# Patient Record
Sex: Male | Born: 1935 | Race: White | Hispanic: No | Marital: Married | Smoking: Never smoker
Health system: Southern US, Community
[De-identification: ages and names within clinical notes are randomized; demographics above are authoritative.]

## PROBLEM LIST (undated history)

## (undated) DIAGNOSIS — E039 Hypothyroidism, unspecified: Secondary | ICD-10-CM

## (undated) DIAGNOSIS — C801 Malignant (primary) neoplasm, unspecified: Secondary | ICD-10-CM

## (undated) DIAGNOSIS — M539 Dorsopathy, unspecified: Secondary | ICD-10-CM

## (undated) DIAGNOSIS — M1990 Unspecified osteoarthritis, unspecified site: Secondary | ICD-10-CM

## (undated) DIAGNOSIS — K219 Gastro-esophageal reflux disease without esophagitis: Secondary | ICD-10-CM

## (undated) DIAGNOSIS — H269 Unspecified cataract: Secondary | ICD-10-CM

## (undated) DIAGNOSIS — Z8719 Personal history of other diseases of the digestive system: Secondary | ICD-10-CM

## (undated) DIAGNOSIS — C20 Malignant neoplasm of rectum: Secondary | ICD-10-CM

## (undated) DIAGNOSIS — H9190 Unspecified hearing loss, unspecified ear: Secondary | ICD-10-CM

---

## 1994-05-19 DIAGNOSIS — Z8719 Personal history of other diseases of the digestive system: Secondary | ICD-10-CM

## 1994-05-19 HISTORY — DX: Personal history of other diseases of the digestive system: Z87.19

## 1994-05-19 HISTORY — PX: HERNIA REPAIR: SHX51

## 1998-05-19 DIAGNOSIS — M1990 Unspecified osteoarthritis, unspecified site: Secondary | ICD-10-CM

## 1998-05-19 HISTORY — DX: Unspecified osteoarthritis, unspecified site: M19.90

## 2003-05-20 DIAGNOSIS — H269 Unspecified cataract: Secondary | ICD-10-CM

## 2003-05-20 HISTORY — DX: Unspecified cataract: H26.9

## 2011-08-18 DIAGNOSIS — E782 Mixed hyperlipidemia: Secondary | ICD-10-CM | POA: Diagnosis not present

## 2011-08-21 DIAGNOSIS — M545 Low back pain: Secondary | ICD-10-CM | POA: Diagnosis not present

## 2011-08-21 DIAGNOSIS — E782 Mixed hyperlipidemia: Secondary | ICD-10-CM | POA: Diagnosis not present

## 2011-08-21 DIAGNOSIS — I10 Essential (primary) hypertension: Secondary | ICD-10-CM | POA: Diagnosis not present

## 2011-08-21 DIAGNOSIS — M1990 Unspecified osteoarthritis, unspecified site: Secondary | ICD-10-CM | POA: Diagnosis not present

## 2011-08-21 DIAGNOSIS — L2089 Other atopic dermatitis: Secondary | ICD-10-CM | POA: Diagnosis not present

## 2011-08-21 DIAGNOSIS — E039 Hypothyroidism, unspecified: Secondary | ICD-10-CM | POA: Diagnosis not present

## 2011-08-27 DIAGNOSIS — L820 Inflamed seborrheic keratosis: Secondary | ICD-10-CM | POA: Diagnosis not present

## 2011-12-19 DIAGNOSIS — E782 Mixed hyperlipidemia: Secondary | ICD-10-CM | POA: Diagnosis not present

## 2011-12-19 DIAGNOSIS — I10 Essential (primary) hypertension: Secondary | ICD-10-CM | POA: Diagnosis not present

## 2011-12-24 DIAGNOSIS — M545 Low back pain: Secondary | ICD-10-CM | POA: Diagnosis not present

## 2011-12-24 DIAGNOSIS — M1990 Unspecified osteoarthritis, unspecified site: Secondary | ICD-10-CM | POA: Diagnosis not present

## 2011-12-24 DIAGNOSIS — E039 Hypothyroidism, unspecified: Secondary | ICD-10-CM | POA: Diagnosis not present

## 2011-12-24 DIAGNOSIS — L2089 Other atopic dermatitis: Secondary | ICD-10-CM | POA: Diagnosis not present

## 2011-12-24 DIAGNOSIS — E782 Mixed hyperlipidemia: Secondary | ICD-10-CM | POA: Diagnosis not present

## 2011-12-24 DIAGNOSIS — I10 Essential (primary) hypertension: Secondary | ICD-10-CM | POA: Diagnosis not present

## 2012-03-15 DIAGNOSIS — Z23 Encounter for immunization: Secondary | ICD-10-CM | POA: Diagnosis not present

## 2012-04-20 DIAGNOSIS — E782 Mixed hyperlipidemia: Secondary | ICD-10-CM | POA: Diagnosis not present

## 2012-04-20 DIAGNOSIS — I10 Essential (primary) hypertension: Secondary | ICD-10-CM | POA: Diagnosis not present

## 2012-04-20 DIAGNOSIS — N400 Enlarged prostate without lower urinary tract symptoms: Secondary | ICD-10-CM | POA: Diagnosis not present

## 2012-04-20 DIAGNOSIS — E039 Hypothyroidism, unspecified: Secondary | ICD-10-CM | POA: Diagnosis not present

## 2012-04-27 DIAGNOSIS — Z0000 Encounter for general adult medical examination without abnormal findings: Secondary | ICD-10-CM | POA: Diagnosis not present

## 2012-04-27 DIAGNOSIS — I10 Essential (primary) hypertension: Secondary | ICD-10-CM | POA: Diagnosis not present

## 2012-04-27 DIAGNOSIS — E782 Mixed hyperlipidemia: Secondary | ICD-10-CM | POA: Diagnosis not present

## 2012-04-27 DIAGNOSIS — E039 Hypothyroidism, unspecified: Secondary | ICD-10-CM | POA: Diagnosis not present

## 2012-08-24 DIAGNOSIS — E782 Mixed hyperlipidemia: Secondary | ICD-10-CM | POA: Diagnosis not present

## 2012-08-24 DIAGNOSIS — I10 Essential (primary) hypertension: Secondary | ICD-10-CM | POA: Diagnosis not present

## 2012-08-24 DIAGNOSIS — E039 Hypothyroidism, unspecified: Secondary | ICD-10-CM | POA: Diagnosis not present

## 2012-08-30 DIAGNOSIS — E039 Hypothyroidism, unspecified: Secondary | ICD-10-CM | POA: Diagnosis not present

## 2012-08-30 DIAGNOSIS — L2089 Other atopic dermatitis: Secondary | ICD-10-CM | POA: Diagnosis not present

## 2012-08-30 DIAGNOSIS — E782 Mixed hyperlipidemia: Secondary | ICD-10-CM | POA: Diagnosis not present

## 2012-08-30 DIAGNOSIS — L500 Allergic urticaria: Secondary | ICD-10-CM | POA: Diagnosis not present

## 2012-08-30 DIAGNOSIS — I10 Essential (primary) hypertension: Secondary | ICD-10-CM | POA: Diagnosis not present

## 2012-08-30 DIAGNOSIS — M545 Low back pain: Secondary | ICD-10-CM | POA: Diagnosis not present

## 2012-08-30 DIAGNOSIS — M1990 Unspecified osteoarthritis, unspecified site: Secondary | ICD-10-CM | POA: Diagnosis not present

## 2012-09-28 DIAGNOSIS — J309 Allergic rhinitis, unspecified: Secondary | ICD-10-CM | POA: Diagnosis not present

## 2012-09-28 DIAGNOSIS — L299 Pruritus, unspecified: Secondary | ICD-10-CM | POA: Diagnosis not present

## 2012-09-28 DIAGNOSIS — H1045 Other chronic allergic conjunctivitis: Secondary | ICD-10-CM | POA: Diagnosis not present

## 2012-09-28 DIAGNOSIS — R21 Rash and other nonspecific skin eruption: Secondary | ICD-10-CM | POA: Diagnosis not present

## 2012-11-02 DIAGNOSIS — H1045 Other chronic allergic conjunctivitis: Secondary | ICD-10-CM | POA: Diagnosis not present

## 2012-11-02 DIAGNOSIS — J309 Allergic rhinitis, unspecified: Secondary | ICD-10-CM | POA: Diagnosis not present

## 2012-12-07 DIAGNOSIS — J309 Allergic rhinitis, unspecified: Secondary | ICD-10-CM | POA: Diagnosis not present

## 2012-12-07 DIAGNOSIS — R21 Rash and other nonspecific skin eruption: Secondary | ICD-10-CM | POA: Diagnosis not present

## 2013-01-03 DIAGNOSIS — I10 Essential (primary) hypertension: Secondary | ICD-10-CM | POA: Diagnosis not present

## 2013-01-03 DIAGNOSIS — E782 Mixed hyperlipidemia: Secondary | ICD-10-CM | POA: Diagnosis not present

## 2013-01-10 DIAGNOSIS — L500 Allergic urticaria: Secondary | ICD-10-CM | POA: Diagnosis not present

## 2013-01-10 DIAGNOSIS — M1990 Unspecified osteoarthritis, unspecified site: Secondary | ICD-10-CM | POA: Diagnosis not present

## 2013-01-10 DIAGNOSIS — L2089 Other atopic dermatitis: Secondary | ICD-10-CM | POA: Diagnosis not present

## 2013-01-10 DIAGNOSIS — I10 Essential (primary) hypertension: Secondary | ICD-10-CM | POA: Diagnosis not present

## 2013-01-10 DIAGNOSIS — E782 Mixed hyperlipidemia: Secondary | ICD-10-CM | POA: Diagnosis not present

## 2013-01-10 DIAGNOSIS — M545 Low back pain: Secondary | ICD-10-CM | POA: Diagnosis not present

## 2013-01-10 DIAGNOSIS — E039 Hypothyroidism, unspecified: Secondary | ICD-10-CM | POA: Diagnosis not present

## 2013-01-20 DIAGNOSIS — L820 Inflamed seborrheic keratosis: Secondary | ICD-10-CM | POA: Diagnosis not present

## 2013-02-28 DIAGNOSIS — Z23 Encounter for immunization: Secondary | ICD-10-CM | POA: Diagnosis not present

## 2013-03-24 DIAGNOSIS — R300 Dysuria: Secondary | ICD-10-CM | POA: Diagnosis not present

## 2013-03-24 DIAGNOSIS — N453 Epididymo-orchitis: Secondary | ICD-10-CM | POA: Diagnosis not present

## 2013-04-26 DIAGNOSIS — R21 Rash and other nonspecific skin eruption: Secondary | ICD-10-CM | POA: Diagnosis not present

## 2013-04-28 DIAGNOSIS — H538 Other visual disturbances: Secondary | ICD-10-CM | POA: Diagnosis not present

## 2013-04-28 DIAGNOSIS — H3530 Unspecified macular degeneration: Secondary | ICD-10-CM | POA: Diagnosis not present

## 2013-04-28 DIAGNOSIS — H2510 Age-related nuclear cataract, unspecified eye: Secondary | ICD-10-CM | POA: Diagnosis not present

## 2013-05-19 HISTORY — PX: CATARACT EXTRACTION W/ INTRAOCULAR LENS IMPLANT: SHX1309

## 2013-06-06 DIAGNOSIS — M1990 Unspecified osteoarthritis, unspecified site: Secondary | ICD-10-CM | POA: Diagnosis not present

## 2013-06-06 DIAGNOSIS — E039 Hypothyroidism, unspecified: Secondary | ICD-10-CM | POA: Diagnosis not present

## 2013-06-06 DIAGNOSIS — I10 Essential (primary) hypertension: Secondary | ICD-10-CM | POA: Diagnosis not present

## 2013-06-06 DIAGNOSIS — E782 Mixed hyperlipidemia: Secondary | ICD-10-CM | POA: Diagnosis not present

## 2013-06-13 DIAGNOSIS — E039 Hypothyroidism, unspecified: Secondary | ICD-10-CM | POA: Diagnosis not present

## 2013-06-13 DIAGNOSIS — L2089 Other atopic dermatitis: Secondary | ICD-10-CM | POA: Diagnosis not present

## 2013-06-13 DIAGNOSIS — Z0000 Encounter for general adult medical examination without abnormal findings: Secondary | ICD-10-CM | POA: Diagnosis not present

## 2013-06-13 DIAGNOSIS — M545 Low back pain, unspecified: Secondary | ICD-10-CM | POA: Diagnosis not present

## 2013-06-13 DIAGNOSIS — I10 Essential (primary) hypertension: Secondary | ICD-10-CM | POA: Diagnosis not present

## 2013-06-13 DIAGNOSIS — Z1331 Encounter for screening for depression: Secondary | ICD-10-CM | POA: Diagnosis not present

## 2013-06-13 DIAGNOSIS — E782 Mixed hyperlipidemia: Secondary | ICD-10-CM | POA: Diagnosis not present

## 2013-06-13 DIAGNOSIS — M1990 Unspecified osteoarthritis, unspecified site: Secondary | ICD-10-CM | POA: Diagnosis not present

## 2013-07-20 DIAGNOSIS — H538 Other visual disturbances: Secondary | ICD-10-CM | POA: Diagnosis not present

## 2013-07-20 DIAGNOSIS — H2510 Age-related nuclear cataract, unspecified eye: Secondary | ICD-10-CM | POA: Diagnosis not present

## 2013-09-14 DIAGNOSIS — L820 Inflamed seborrheic keratosis: Secondary | ICD-10-CM | POA: Diagnosis not present

## 2013-09-16 DIAGNOSIS — J019 Acute sinusitis, unspecified: Secondary | ICD-10-CM | POA: Diagnosis not present

## 2013-09-22 DIAGNOSIS — H2510 Age-related nuclear cataract, unspecified eye: Secondary | ICD-10-CM | POA: Diagnosis not present

## 2013-09-22 DIAGNOSIS — H538 Other visual disturbances: Secondary | ICD-10-CM | POA: Diagnosis not present

## 2013-09-26 DIAGNOSIS — H269 Unspecified cataract: Secondary | ICD-10-CM | POA: Diagnosis not present

## 2013-09-26 DIAGNOSIS — H52209 Unspecified astigmatism, unspecified eye: Secondary | ICD-10-CM | POA: Diagnosis not present

## 2013-09-26 DIAGNOSIS — H2510 Age-related nuclear cataract, unspecified eye: Secondary | ICD-10-CM | POA: Diagnosis not present

## 2013-09-26 DIAGNOSIS — H538 Other visual disturbances: Secondary | ICD-10-CM | POA: Diagnosis not present

## 2013-09-26 DIAGNOSIS — H5200 Hypermetropia, unspecified eye: Secondary | ICD-10-CM | POA: Diagnosis not present

## 2013-09-26 DIAGNOSIS — K219 Gastro-esophageal reflux disease without esophagitis: Secondary | ICD-10-CM | POA: Diagnosis not present

## 2013-09-26 DIAGNOSIS — I10 Essential (primary) hypertension: Secondary | ICD-10-CM | POA: Diagnosis not present

## 2013-09-26 DIAGNOSIS — Z79899 Other long term (current) drug therapy: Secondary | ICD-10-CM | POA: Diagnosis not present

## 2013-09-26 DIAGNOSIS — H02059 Trichiasis without entropian unspecified eye, unspecified eyelid: Secondary | ICD-10-CM | POA: Diagnosis not present

## 2013-09-26 DIAGNOSIS — H524 Presbyopia: Secondary | ICD-10-CM | POA: Diagnosis not present

## 2013-09-26 DIAGNOSIS — Z7982 Long term (current) use of aspirin: Secondary | ICD-10-CM | POA: Diagnosis not present

## 2013-09-26 DIAGNOSIS — H11049 Peripheral pterygium, stationary, unspecified eye: Secondary | ICD-10-CM | POA: Diagnosis not present

## 2013-09-26 DIAGNOSIS — H1045 Other chronic allergic conjunctivitis: Secondary | ICD-10-CM | POA: Diagnosis not present

## 2013-09-26 DIAGNOSIS — H3530 Unspecified macular degeneration: Secondary | ICD-10-CM | POA: Diagnosis not present

## 2013-10-12 DIAGNOSIS — I10 Essential (primary) hypertension: Secondary | ICD-10-CM | POA: Diagnosis not present

## 2013-10-12 DIAGNOSIS — E782 Mixed hyperlipidemia: Secondary | ICD-10-CM | POA: Diagnosis not present

## 2013-10-19 DIAGNOSIS — E782 Mixed hyperlipidemia: Secondary | ICD-10-CM | POA: Diagnosis not present

## 2013-10-19 DIAGNOSIS — M545 Low back pain, unspecified: Secondary | ICD-10-CM | POA: Diagnosis not present

## 2013-10-19 DIAGNOSIS — M1990 Unspecified osteoarthritis, unspecified site: Secondary | ICD-10-CM | POA: Diagnosis not present

## 2013-10-19 DIAGNOSIS — Z1331 Encounter for screening for depression: Secondary | ICD-10-CM | POA: Diagnosis not present

## 2013-10-19 DIAGNOSIS — E039 Hypothyroidism, unspecified: Secondary | ICD-10-CM | POA: Diagnosis not present

## 2013-10-19 DIAGNOSIS — I10 Essential (primary) hypertension: Secondary | ICD-10-CM | POA: Diagnosis not present

## 2013-10-19 DIAGNOSIS — L500 Allergic urticaria: Secondary | ICD-10-CM | POA: Diagnosis not present

## 2013-10-19 DIAGNOSIS — L2089 Other atopic dermatitis: Secondary | ICD-10-CM | POA: Diagnosis not present

## 2013-10-20 DIAGNOSIS — R197 Diarrhea, unspecified: Secondary | ICD-10-CM | POA: Diagnosis not present

## 2013-11-23 DIAGNOSIS — K625 Hemorrhage of anus and rectum: Secondary | ICD-10-CM | POA: Diagnosis not present

## 2013-12-01 DIAGNOSIS — Z9109 Other allergy status, other than to drugs and biological substances: Secondary | ICD-10-CM | POA: Diagnosis not present

## 2013-12-01 DIAGNOSIS — K621 Rectal polyp: Secondary | ICD-10-CM | POA: Diagnosis not present

## 2013-12-01 DIAGNOSIS — C20 Malignant neoplasm of rectum: Secondary | ICD-10-CM | POA: Diagnosis not present

## 2013-12-01 DIAGNOSIS — C189 Malignant neoplasm of colon, unspecified: Secondary | ICD-10-CM | POA: Diagnosis not present

## 2013-12-01 DIAGNOSIS — K620 Anal polyp: Secondary | ICD-10-CM | POA: Diagnosis not present

## 2013-12-01 DIAGNOSIS — K219 Gastro-esophageal reflux disease without esophagitis: Secondary | ICD-10-CM | POA: Diagnosis not present

## 2013-12-01 DIAGNOSIS — K625 Hemorrhage of anus and rectum: Secondary | ICD-10-CM | POA: Diagnosis not present

## 2013-12-01 DIAGNOSIS — E039 Hypothyroidism, unspecified: Secondary | ICD-10-CM | POA: Diagnosis not present

## 2013-12-01 DIAGNOSIS — M4120 Other idiopathic scoliosis, site unspecified: Secondary | ICD-10-CM | POA: Diagnosis not present

## 2013-12-01 DIAGNOSIS — Z79899 Other long term (current) drug therapy: Secondary | ICD-10-CM | POA: Diagnosis not present

## 2013-12-01 DIAGNOSIS — I10 Essential (primary) hypertension: Secondary | ICD-10-CM | POA: Diagnosis not present

## 2013-12-08 DIAGNOSIS — C20 Malignant neoplasm of rectum: Secondary | ICD-10-CM | POA: Diagnosis not present

## 2013-12-08 DIAGNOSIS — C19 Malignant neoplasm of rectosigmoid junction: Secondary | ICD-10-CM | POA: Diagnosis not present

## 2013-12-08 DIAGNOSIS — C189 Malignant neoplasm of colon, unspecified: Secondary | ICD-10-CM | POA: Diagnosis not present

## 2013-12-13 ENCOUNTER — Other Ambulatory Visit: Payer: Self-pay

## 2013-12-13 ENCOUNTER — Telehealth: Payer: Self-pay

## 2013-12-13 DIAGNOSIS — C20 Malignant neoplasm of rectum: Secondary | ICD-10-CM

## 2013-12-13 DIAGNOSIS — R935 Abnormal findings on diagnostic imaging of other abdominal regions, including retroperitoneum: Secondary | ICD-10-CM | POA: Diagnosis not present

## 2013-12-13 DIAGNOSIS — C19 Malignant neoplasm of rectosigmoid junction: Secondary | ICD-10-CM | POA: Diagnosis not present

## 2013-12-13 NOTE — Telephone Encounter (Signed)
I have spoken to Casper at [facility]. The instructions for the EUS have been faxed to her. She will give these to Mr Dull. Spoke with Mrs. Fesler and advised her of the date and the instructions to be picked up.

## 2013-12-22 ENCOUNTER — Ambulatory Visit (HOSPITAL_COMMUNITY)
Admission: RE | Admit: 2013-12-22 | Discharge: 2013-12-22 | Disposition: A | Discharge status: Home / Self Care | Payer: Medicare Other | Source: Ambulatory Visit | Attending: Gastroenterology | Admitting: Gastroenterology

## 2013-12-22 ENCOUNTER — Encounter (HOSPITAL_COMMUNITY)
Admission: RE | Disposition: A | Discharge status: Home / Self Care | Payer: Self-pay | Source: Ambulatory Visit | Attending: Gastroenterology

## 2013-12-22 ENCOUNTER — Encounter (HOSPITAL_COMMUNITY): Payer: Self-pay

## 2013-12-22 DIAGNOSIS — C20 Malignant neoplasm of rectum: Secondary | ICD-10-CM | POA: Diagnosis not present

## 2013-12-22 DIAGNOSIS — M1990 Unspecified osteoarthritis, unspecified site: Secondary | ICD-10-CM | POA: Diagnosis not present

## 2013-12-22 DIAGNOSIS — K219 Gastro-esophageal reflux disease without esophagitis: Secondary | ICD-10-CM | POA: Insufficient documentation

## 2013-12-22 DIAGNOSIS — E039 Hypothyroidism, unspecified: Secondary | ICD-10-CM | POA: Insufficient documentation

## 2013-12-22 HISTORY — DX: Gastro-esophageal reflux disease without esophagitis: K21.9

## 2013-12-22 HISTORY — DX: Dorsopathy, unspecified: M53.9

## 2013-12-22 HISTORY — DX: Unspecified osteoarthritis, unspecified site: M19.90

## 2013-12-22 HISTORY — PX: EUS: SHX5427

## 2013-12-22 HISTORY — DX: Hypothyroidism, unspecified: E03.9

## 2013-12-22 HISTORY — DX: Personal history of other diseases of the digestive system: Z87.19

## 2013-12-22 HISTORY — DX: Unspecified cataract: H26.9

## 2013-12-22 SURGERY — ULTRASOUND, LOWER GI TRACT, ENDOSCOPIC
Anesthesia: Moderate Sedation

## 2013-12-22 MED ORDER — MIDAZOLAM HCL 10 MG/2ML IJ SOLN
INTRAMUSCULAR | Status: AC
  Filled 2013-12-22: qty 4

## 2013-12-22 MED ORDER — MIDAZOLAM HCL 10 MG/2ML IJ SOLN
INTRAMUSCULAR | Status: DC | PRN
  Administered 2013-12-22: 1 mg via INTRAVENOUS
  Administered 2013-12-22: 2 mg via INTRAVENOUS

## 2013-12-22 MED ORDER — SODIUM CHLORIDE 0.9 % IV SOLN
INTRAVENOUS | Status: DC
  Administered 2013-12-22: 14:00:00 via INTRAVENOUS

## 2013-12-22 MED ORDER — FENTANYL CITRATE 0.05 MG/ML IJ SOLN
INTRAMUSCULAR | Status: DC | PRN
  Administered 2013-12-22: 25 ug via INTRAVENOUS
  Administered 2013-12-22: 12.5 ug via INTRAVENOUS

## 2013-12-22 MED ORDER — FENTANYL CITRATE 0.05 MG/ML IJ SOLN
INTRAMUSCULAR | Status: AC
  Filled 2013-12-22: qty 2

## 2013-12-22 NOTE — Discharge Instructions (Signed)

## 2013-12-22 NOTE — Op Note (Signed)
[facility] [place], [address]   ENDOSCOPIC ULTRASOUND PROCEDURE REPORT  PATIENT: Michael Baldwin, Michael Baldwin  MR#: [id number] BIRTHDATE: Apr 19, 1933  GENDER: Male ENDOSCOPIST: Milus Banister, MD REFERRED BY:  Dr. Burke Keels [facility] in [place], [place]) PROCEDURE DATE:  12/22/2013 PROCEDURE:   Lower EUS ASA CLASS:      Class II INDICATIONS:   recently diagnosed rectal cancer (colonoscopy Dr. Anthony Sar); CT abdomen, pelvis without obvious metastatic disease. MEDICATIONS:  Fentanyl 37.5 mcg IV and Versed 3 mg IV  DESCRIPTION OF PROCEDURE:   After the risks benefits and alternatives of the procedure were  explained, informed consent was obtained. The patient was then placed in the left, lateral, decubitus postion and IV sedation was administered. Throughout the procedure, the patients blood pressure, pulse and oxygen saturations were monitored continuously.  Under direct visualization, the Pentax Radial EUS [id number]  endoscope was introduced through the anus and advanced to the sigmoid colon . Water was used as necessary to provide an acoustic interface.  Upon completion of the imaging, water was removed and the patient was sent to the recovery room in satisfactory condition.  Sigmoidoscopic findings: 1. Malignant appearing mass in posterior rectum. This was non-obstructive, occupied 20% lumen circumference, lay in posterior rectum with distal edge 8cm from the anal verge.  EUS findings: 1. The mass above correlated with a 2.8cm hypoechoic, heterogeneous mass that appears to invade into but not through the muscularis propria layer (uT2). 2. No perirectal or iliac adenopathy (uN0).  Impression: Non-obstructive 2.8cm uT2N0 (stage I) rectal adenocarcinoma that lays in posterior rectum with distal edge 8cm from the anal verge.   _______________________________ eSignedMilus Banister, MD 12/22/2013 2:35 PM

## 2013-12-22 NOTE — H&P (Signed)
  HPI: This is a man with recently diagnosed rectal adenocarcinoma (Dr.DeMason colonoscopy).  NO mets on CT abd/pelvis    Past Medical History  Diagnosis Date  . H/O hiatal hernia 1996  . Cataract, right 2005  . Spinal disorder     scholeosis  . Osteoarthritis 2000  . GERD (gastroesophageal reflux disease)   . Hypothyroidism     Past Surgical History  Procedure Laterality Date  . Cataract extraction w/ intraocular lens implant  2015  . Hernia repair  1996    Current Facility-Administered Medications  Medication Dose Route Frequency Provider Last Rate Last Dose  . 0.9 %  sodium chloride infusion   Intravenous Continuous Milus Banister, MD 20 mL/hr at 12/22/13 1356      Allergies as of 12/13/2013  . (Not on File)    History reviewed. No pertinent family history.  History   Social History  . Marital Status: Married    Spouse Name: N/A    Number of Children: N/A  . Years of Education: N/A   Occupational History  . Not on file.   Social History Main Topics  . Smoking status: Never Smoker   . Smokeless tobacco: Not on file  . Alcohol Use: No  . Drug Use: No  . Sexual Activity: Not on file   Other Topics Concern  . Not on file   Social History Narrative  . No narrative on file      Physical Exam: BP 133/79  Pulse 68  Temp(Src) 98.3 F (36.8 C) (Oral)  Resp 17  Ht 5\' 9"  (1.753 m)  Wt 131 lb (59.421 kg)  BMI 19.34 kg/m2  SpO2 99% Constitutional: generally well-appearing Psychiatric: alert and oriented x3 Abdomen: soft, nontender, nondistended, no obvious ascites, no peritoneal signs, normal bowel sounds     Assessment and plan: 80 y.o. male with recently diagnosed rectal cancer   For EUS staging today

## 2013-12-26 ENCOUNTER — Encounter (HOSPITAL_COMMUNITY): Payer: Self-pay | Admitting: Gastroenterology

## 2013-12-26 DIAGNOSIS — C20 Malignant neoplasm of rectum: Secondary | ICD-10-CM | POA: Diagnosis not present

## 2013-12-28 DIAGNOSIS — C189 Malignant neoplasm of colon, unspecified: Secondary | ICD-10-CM | POA: Diagnosis not present

## 2013-12-28 DIAGNOSIS — R5381 Other malaise: Secondary | ICD-10-CM | POA: Diagnosis not present

## 2013-12-28 DIAGNOSIS — C20 Malignant neoplasm of rectum: Secondary | ICD-10-CM | POA: Diagnosis not present

## 2014-01-02 DIAGNOSIS — M4120 Other idiopathic scoliosis, site unspecified: Secondary | ICD-10-CM | POA: Diagnosis not present

## 2014-01-02 DIAGNOSIS — E7800 Pure hypercholesterolemia, unspecified: Secondary | ICD-10-CM | POA: Diagnosis not present

## 2014-01-02 DIAGNOSIS — Z803 Family history of malignant neoplasm of breast: Secondary | ICD-10-CM | POA: Diagnosis not present

## 2014-01-02 DIAGNOSIS — C20 Malignant neoplasm of rectum: Secondary | ICD-10-CM | POA: Diagnosis not present

## 2014-01-02 DIAGNOSIS — Z9109 Other allergy status, other than to drugs and biological substances: Secondary | ICD-10-CM | POA: Diagnosis not present

## 2014-01-02 DIAGNOSIS — Z510 Encounter for antineoplastic radiation therapy: Secondary | ICD-10-CM | POA: Diagnosis not present

## 2014-01-02 DIAGNOSIS — M1990 Unspecified osteoarthritis, unspecified site: Secondary | ICD-10-CM | POA: Diagnosis not present

## 2014-01-02 DIAGNOSIS — Z87891 Personal history of nicotine dependence: Secondary | ICD-10-CM | POA: Diagnosis not present

## 2014-01-02 DIAGNOSIS — Z79899 Other long term (current) drug therapy: Secondary | ICD-10-CM | POA: Diagnosis not present

## 2014-01-04 DIAGNOSIS — M1990 Unspecified osteoarthritis, unspecified site: Secondary | ICD-10-CM | POA: Diagnosis not present

## 2014-01-04 DIAGNOSIS — C20 Malignant neoplasm of rectum: Secondary | ICD-10-CM | POA: Diagnosis not present

## 2014-01-04 DIAGNOSIS — M4120 Other idiopathic scoliosis, site unspecified: Secondary | ICD-10-CM | POA: Diagnosis not present

## 2014-01-04 DIAGNOSIS — E7800 Pure hypercholesterolemia, unspecified: Secondary | ICD-10-CM | POA: Diagnosis not present

## 2014-01-04 DIAGNOSIS — Z79899 Other long term (current) drug therapy: Secondary | ICD-10-CM | POA: Diagnosis not present

## 2014-01-04 DIAGNOSIS — Z510 Encounter for antineoplastic radiation therapy: Secondary | ICD-10-CM | POA: Diagnosis not present

## 2014-01-05 DIAGNOSIS — Z79899 Other long term (current) drug therapy: Secondary | ICD-10-CM | POA: Diagnosis not present

## 2014-01-05 DIAGNOSIS — M4120 Other idiopathic scoliosis, site unspecified: Secondary | ICD-10-CM | POA: Diagnosis not present

## 2014-01-05 DIAGNOSIS — M1990 Unspecified osteoarthritis, unspecified site: Secondary | ICD-10-CM | POA: Diagnosis not present

## 2014-01-05 DIAGNOSIS — Z510 Encounter for antineoplastic radiation therapy: Secondary | ICD-10-CM | POA: Diagnosis not present

## 2014-01-05 DIAGNOSIS — C20 Malignant neoplasm of rectum: Secondary | ICD-10-CM | POA: Diagnosis not present

## 2014-01-05 DIAGNOSIS — E7800 Pure hypercholesterolemia, unspecified: Secondary | ICD-10-CM | POA: Diagnosis not present

## 2014-01-06 DIAGNOSIS — M1990 Unspecified osteoarthritis, unspecified site: Secondary | ICD-10-CM | POA: Diagnosis not present

## 2014-01-06 DIAGNOSIS — Z79899 Other long term (current) drug therapy: Secondary | ICD-10-CM | POA: Diagnosis not present

## 2014-01-06 DIAGNOSIS — C20 Malignant neoplasm of rectum: Secondary | ICD-10-CM | POA: Diagnosis not present

## 2014-01-06 DIAGNOSIS — Z510 Encounter for antineoplastic radiation therapy: Secondary | ICD-10-CM | POA: Diagnosis not present

## 2014-01-06 DIAGNOSIS — M4120 Other idiopathic scoliosis, site unspecified: Secondary | ICD-10-CM | POA: Diagnosis not present

## 2014-01-06 DIAGNOSIS — E7800 Pure hypercholesterolemia, unspecified: Secondary | ICD-10-CM | POA: Diagnosis not present

## 2014-01-09 DIAGNOSIS — Z79899 Other long term (current) drug therapy: Secondary | ICD-10-CM | POA: Diagnosis not present

## 2014-01-09 DIAGNOSIS — M4120 Other idiopathic scoliosis, site unspecified: Secondary | ICD-10-CM | POA: Diagnosis not present

## 2014-01-09 DIAGNOSIS — C20 Malignant neoplasm of rectum: Secondary | ICD-10-CM | POA: Diagnosis not present

## 2014-01-09 DIAGNOSIS — E7800 Pure hypercholesterolemia, unspecified: Secondary | ICD-10-CM | POA: Diagnosis not present

## 2014-01-09 DIAGNOSIS — M1990 Unspecified osteoarthritis, unspecified site: Secondary | ICD-10-CM | POA: Diagnosis not present

## 2014-01-09 DIAGNOSIS — Z510 Encounter for antineoplastic radiation therapy: Secondary | ICD-10-CM | POA: Diagnosis not present

## 2014-01-10 DIAGNOSIS — M4120 Other idiopathic scoliosis, site unspecified: Secondary | ICD-10-CM | POA: Diagnosis not present

## 2014-01-10 DIAGNOSIS — Z510 Encounter for antineoplastic radiation therapy: Secondary | ICD-10-CM | POA: Diagnosis not present

## 2014-01-10 DIAGNOSIS — Z79899 Other long term (current) drug therapy: Secondary | ICD-10-CM | POA: Diagnosis not present

## 2014-01-10 DIAGNOSIS — M1990 Unspecified osteoarthritis, unspecified site: Secondary | ICD-10-CM | POA: Diagnosis not present

## 2014-01-10 DIAGNOSIS — E7800 Pure hypercholesterolemia, unspecified: Secondary | ICD-10-CM | POA: Diagnosis not present

## 2014-01-10 DIAGNOSIS — C20 Malignant neoplasm of rectum: Secondary | ICD-10-CM | POA: Diagnosis not present

## 2014-01-11 DIAGNOSIS — E7800 Pure hypercholesterolemia, unspecified: Secondary | ICD-10-CM | POA: Diagnosis not present

## 2014-01-11 DIAGNOSIS — M4120 Other idiopathic scoliosis, site unspecified: Secondary | ICD-10-CM | POA: Diagnosis not present

## 2014-01-11 DIAGNOSIS — M1990 Unspecified osteoarthritis, unspecified site: Secondary | ICD-10-CM | POA: Diagnosis not present

## 2014-01-11 DIAGNOSIS — Z79899 Other long term (current) drug therapy: Secondary | ICD-10-CM | POA: Diagnosis not present

## 2014-01-11 DIAGNOSIS — R5381 Other malaise: Secondary | ICD-10-CM | POA: Diagnosis not present

## 2014-01-11 DIAGNOSIS — C20 Malignant neoplasm of rectum: Secondary | ICD-10-CM | POA: Diagnosis not present

## 2014-01-11 DIAGNOSIS — Z510 Encounter for antineoplastic radiation therapy: Secondary | ICD-10-CM | POA: Diagnosis not present

## 2014-01-11 DIAGNOSIS — E875 Hyperkalemia: Secondary | ICD-10-CM | POA: Diagnosis not present

## 2014-01-12 DIAGNOSIS — M1990 Unspecified osteoarthritis, unspecified site: Secondary | ICD-10-CM | POA: Diagnosis not present

## 2014-01-12 DIAGNOSIS — Z510 Encounter for antineoplastic radiation therapy: Secondary | ICD-10-CM | POA: Diagnosis not present

## 2014-01-12 DIAGNOSIS — E7800 Pure hypercholesterolemia, unspecified: Secondary | ICD-10-CM | POA: Diagnosis not present

## 2014-01-12 DIAGNOSIS — M4120 Other idiopathic scoliosis, site unspecified: Secondary | ICD-10-CM | POA: Diagnosis not present

## 2014-01-12 DIAGNOSIS — C20 Malignant neoplasm of rectum: Secondary | ICD-10-CM | POA: Diagnosis not present

## 2014-01-12 DIAGNOSIS — Z79899 Other long term (current) drug therapy: Secondary | ICD-10-CM | POA: Diagnosis not present

## 2014-01-13 DIAGNOSIS — Z510 Encounter for antineoplastic radiation therapy: Secondary | ICD-10-CM | POA: Diagnosis not present

## 2014-01-13 DIAGNOSIS — C20 Malignant neoplasm of rectum: Secondary | ICD-10-CM | POA: Diagnosis not present

## 2014-01-13 DIAGNOSIS — Z79899 Other long term (current) drug therapy: Secondary | ICD-10-CM | POA: Diagnosis not present

## 2014-01-13 DIAGNOSIS — M4120 Other idiopathic scoliosis, site unspecified: Secondary | ICD-10-CM | POA: Diagnosis not present

## 2014-01-13 DIAGNOSIS — E7800 Pure hypercholesterolemia, unspecified: Secondary | ICD-10-CM | POA: Diagnosis not present

## 2014-01-13 DIAGNOSIS — M1990 Unspecified osteoarthritis, unspecified site: Secondary | ICD-10-CM | POA: Diagnosis not present

## 2014-01-16 DIAGNOSIS — M4120 Other idiopathic scoliosis, site unspecified: Secondary | ICD-10-CM | POA: Diagnosis not present

## 2014-01-16 DIAGNOSIS — E7800 Pure hypercholesterolemia, unspecified: Secondary | ICD-10-CM | POA: Diagnosis not present

## 2014-01-16 DIAGNOSIS — Z79899 Other long term (current) drug therapy: Secondary | ICD-10-CM | POA: Diagnosis not present

## 2014-01-16 DIAGNOSIS — Z510 Encounter for antineoplastic radiation therapy: Secondary | ICD-10-CM | POA: Diagnosis not present

## 2014-01-16 DIAGNOSIS — C20 Malignant neoplasm of rectum: Secondary | ICD-10-CM | POA: Diagnosis not present

## 2014-01-16 DIAGNOSIS — M1990 Unspecified osteoarthritis, unspecified site: Secondary | ICD-10-CM | POA: Diagnosis not present

## 2014-01-17 DIAGNOSIS — C20 Malignant neoplasm of rectum: Secondary | ICD-10-CM | POA: Diagnosis not present

## 2014-01-17 DIAGNOSIS — Z510 Encounter for antineoplastic radiation therapy: Secondary | ICD-10-CM | POA: Diagnosis not present

## 2014-01-19 DIAGNOSIS — C20 Malignant neoplasm of rectum: Secondary | ICD-10-CM | POA: Diagnosis not present

## 2014-01-24 DIAGNOSIS — C20 Malignant neoplasm of rectum: Secondary | ICD-10-CM | POA: Diagnosis not present

## 2014-01-25 DIAGNOSIS — C20 Malignant neoplasm of rectum: Secondary | ICD-10-CM | POA: Diagnosis not present

## 2014-01-27 DIAGNOSIS — C20 Malignant neoplasm of rectum: Secondary | ICD-10-CM | POA: Diagnosis not present

## 2014-01-27 DIAGNOSIS — R197 Diarrhea, unspecified: Secondary | ICD-10-CM | POA: Diagnosis not present

## 2014-01-30 DIAGNOSIS — E0590 Thyrotoxicosis, unspecified without thyrotoxic crisis or storm: Secondary | ICD-10-CM | POA: Diagnosis not present

## 2014-01-30 DIAGNOSIS — E860 Dehydration: Secondary | ICD-10-CM | POA: Diagnosis not present

## 2014-01-30 DIAGNOSIS — C20 Malignant neoplasm of rectum: Secondary | ICD-10-CM | POA: Diagnosis not present

## 2014-01-30 DIAGNOSIS — I10 Essential (primary) hypertension: Secondary | ICD-10-CM | POA: Diagnosis not present

## 2014-01-30 DIAGNOSIS — Z79899 Other long term (current) drug therapy: Secondary | ICD-10-CM | POA: Diagnosis not present

## 2014-01-30 DIAGNOSIS — K219 Gastro-esophageal reflux disease without esophagitis: Secondary | ICD-10-CM | POA: Diagnosis not present

## 2014-01-30 DIAGNOSIS — Z87891 Personal history of nicotine dependence: Secondary | ICD-10-CM | POA: Diagnosis not present

## 2014-01-30 DIAGNOSIS — R197 Diarrhea, unspecified: Secondary | ICD-10-CM | POA: Diagnosis not present

## 2014-01-31 DIAGNOSIS — C20 Malignant neoplasm of rectum: Secondary | ICD-10-CM | POA: Diagnosis not present

## 2014-01-31 DIAGNOSIS — R197 Diarrhea, unspecified: Secondary | ICD-10-CM | POA: Diagnosis not present

## 2014-01-31 DIAGNOSIS — R112 Nausea with vomiting, unspecified: Secondary | ICD-10-CM | POA: Diagnosis not present

## 2014-01-31 DIAGNOSIS — E860 Dehydration: Secondary | ICD-10-CM | POA: Diagnosis not present

## 2014-02-01 DIAGNOSIS — R197 Diarrhea, unspecified: Secondary | ICD-10-CM | POA: Diagnosis not present

## 2014-02-01 DIAGNOSIS — E860 Dehydration: Secondary | ICD-10-CM | POA: Diagnosis not present

## 2014-02-01 DIAGNOSIS — C20 Malignant neoplasm of rectum: Secondary | ICD-10-CM | POA: Diagnosis not present

## 2014-02-02 DIAGNOSIS — R197 Diarrhea, unspecified: Secondary | ICD-10-CM | POA: Diagnosis not present

## 2014-02-03 DIAGNOSIS — R197 Diarrhea, unspecified: Secondary | ICD-10-CM | POA: Diagnosis not present

## 2014-02-03 DIAGNOSIS — C20 Malignant neoplasm of rectum: Secondary | ICD-10-CM | POA: Diagnosis not present

## 2014-02-03 DIAGNOSIS — R634 Abnormal weight loss: Secondary | ICD-10-CM | POA: Diagnosis not present

## 2014-02-03 DIAGNOSIS — E876 Hypokalemia: Secondary | ICD-10-CM | POA: Diagnosis not present

## 2014-02-09 DIAGNOSIS — C20 Malignant neoplasm of rectum: Secondary | ICD-10-CM | POA: Diagnosis not present

## 2014-02-10 DIAGNOSIS — C189 Malignant neoplasm of colon, unspecified: Secondary | ICD-10-CM | POA: Diagnosis not present

## 2014-02-10 DIAGNOSIS — C20 Malignant neoplasm of rectum: Secondary | ICD-10-CM | POA: Diagnosis not present

## 2014-02-16 DIAGNOSIS — C20 Malignant neoplasm of rectum: Secondary | ICD-10-CM | POA: Diagnosis not present

## 2014-02-16 DIAGNOSIS — Z510 Encounter for antineoplastic radiation therapy: Secondary | ICD-10-CM | POA: Diagnosis not present

## 2014-02-17 DIAGNOSIS — C20 Malignant neoplasm of rectum: Secondary | ICD-10-CM | POA: Diagnosis not present

## 2014-02-21 DIAGNOSIS — C20 Malignant neoplasm of rectum: Secondary | ICD-10-CM | POA: Diagnosis not present

## 2014-02-24 DIAGNOSIS — C189 Malignant neoplasm of colon, unspecified: Secondary | ICD-10-CM | POA: Diagnosis not present

## 2014-02-24 DIAGNOSIS — R197 Diarrhea, unspecified: Secondary | ICD-10-CM | POA: Diagnosis not present

## 2014-03-01 DIAGNOSIS — E782 Mixed hyperlipidemia: Secondary | ICD-10-CM | POA: Diagnosis not present

## 2014-03-01 DIAGNOSIS — E039 Hypothyroidism, unspecified: Secondary | ICD-10-CM | POA: Diagnosis not present

## 2014-03-01 DIAGNOSIS — I10 Essential (primary) hypertension: Secondary | ICD-10-CM | POA: Diagnosis not present

## 2014-03-01 DIAGNOSIS — C20 Malignant neoplasm of rectum: Secondary | ICD-10-CM | POA: Diagnosis not present

## 2014-03-01 DIAGNOSIS — M545 Low back pain: Secondary | ICD-10-CM | POA: Diagnosis not present

## 2014-03-17 DIAGNOSIS — C189 Malignant neoplasm of colon, unspecified: Secondary | ICD-10-CM | POA: Diagnosis not present

## 2014-03-17 DIAGNOSIS — C20 Malignant neoplasm of rectum: Secondary | ICD-10-CM | POA: Diagnosis not present

## 2014-03-20 DIAGNOSIS — C20 Malignant neoplasm of rectum: Secondary | ICD-10-CM | POA: Diagnosis not present

## 2014-03-20 DIAGNOSIS — Z923 Personal history of irradiation: Secondary | ICD-10-CM | POA: Diagnosis not present

## 2014-04-03 DIAGNOSIS — C20 Malignant neoplasm of rectum: Secondary | ICD-10-CM | POA: Diagnosis not present

## 2014-04-10 DIAGNOSIS — Z23 Encounter for immunization: Secondary | ICD-10-CM | POA: Diagnosis not present

## 2014-04-18 DIAGNOSIS — K219 Gastro-esophageal reflux disease without esophagitis: Secondary | ICD-10-CM | POA: Diagnosis not present

## 2014-04-18 DIAGNOSIS — E039 Hypothyroidism, unspecified: Secondary | ICD-10-CM | POA: Diagnosis not present

## 2014-04-18 DIAGNOSIS — M419 Scoliosis, unspecified: Secondary | ICD-10-CM | POA: Diagnosis not present

## 2014-04-18 DIAGNOSIS — Z79899 Other long term (current) drug therapy: Secondary | ICD-10-CM | POA: Diagnosis not present

## 2014-04-18 DIAGNOSIS — C20 Malignant neoplasm of rectum: Secondary | ICD-10-CM | POA: Diagnosis not present

## 2014-04-18 DIAGNOSIS — Z9221 Personal history of antineoplastic chemotherapy: Secondary | ICD-10-CM | POA: Diagnosis not present

## 2014-04-18 DIAGNOSIS — Z91048 Other nonmedicinal substance allergy status: Secondary | ICD-10-CM | POA: Diagnosis not present

## 2014-04-18 DIAGNOSIS — D128 Benign neoplasm of rectum: Secondary | ICD-10-CM | POA: Diagnosis not present

## 2014-04-18 DIAGNOSIS — Z85048 Personal history of other malignant neoplasm of rectum, rectosigmoid junction, and anus: Secondary | ICD-10-CM | POA: Diagnosis not present

## 2014-04-18 DIAGNOSIS — Z923 Personal history of irradiation: Secondary | ICD-10-CM | POA: Diagnosis not present

## 2014-04-18 DIAGNOSIS — I10 Essential (primary) hypertension: Secondary | ICD-10-CM | POA: Diagnosis not present

## 2014-04-19 DIAGNOSIS — D128 Benign neoplasm of rectum: Secondary | ICD-10-CM | POA: Diagnosis not present

## 2014-04-25 DIAGNOSIS — C20 Malignant neoplasm of rectum: Secondary | ICD-10-CM | POA: Diagnosis not present

## 2014-05-03 DIAGNOSIS — R197 Diarrhea, unspecified: Secondary | ICD-10-CM | POA: Diagnosis not present

## 2014-05-03 DIAGNOSIS — R5382 Chronic fatigue, unspecified: Secondary | ICD-10-CM | POA: Diagnosis not present

## 2014-05-03 DIAGNOSIS — C20 Malignant neoplasm of rectum: Secondary | ICD-10-CM | POA: Diagnosis not present

## 2014-05-03 DIAGNOSIS — Z5111 Encounter for antineoplastic chemotherapy: Secondary | ICD-10-CM | POA: Diagnosis not present

## 2014-05-15 DIAGNOSIS — I10 Essential (primary) hypertension: Secondary | ICD-10-CM | POA: Diagnosis not present

## 2014-05-15 DIAGNOSIS — M1990 Unspecified osteoarthritis, unspecified site: Secondary | ICD-10-CM | POA: Diagnosis not present

## 2014-05-15 DIAGNOSIS — E039 Hypothyroidism, unspecified: Secondary | ICD-10-CM | POA: Diagnosis not present

## 2014-05-15 DIAGNOSIS — E782 Mixed hyperlipidemia: Secondary | ICD-10-CM | POA: Diagnosis not present

## 2014-05-24 DIAGNOSIS — C20 Malignant neoplasm of rectum: Secondary | ICD-10-CM | POA: Diagnosis not present

## 2014-05-24 DIAGNOSIS — I10 Essential (primary) hypertension: Secondary | ICD-10-CM | POA: Diagnosis not present

## 2014-05-24 DIAGNOSIS — Z9189 Other specified personal risk factors, not elsewhere classified: Secondary | ICD-10-CM | POA: Diagnosis not present

## 2014-05-24 DIAGNOSIS — M1990 Unspecified osteoarthritis, unspecified site: Secondary | ICD-10-CM | POA: Diagnosis not present

## 2014-05-24 DIAGNOSIS — E039 Hypothyroidism, unspecified: Secondary | ICD-10-CM | POA: Diagnosis not present

## 2014-05-24 DIAGNOSIS — E782 Mixed hyperlipidemia: Secondary | ICD-10-CM | POA: Diagnosis not present

## 2014-05-24 DIAGNOSIS — Z0001 Encounter for general adult medical examination with abnormal findings: Secondary | ICD-10-CM | POA: Diagnosis not present

## 2014-05-25 DIAGNOSIS — Z961 Presence of intraocular lens: Secondary | ICD-10-CM | POA: Diagnosis not present

## 2014-05-25 DIAGNOSIS — H2512 Age-related nuclear cataract, left eye: Secondary | ICD-10-CM | POA: Diagnosis not present

## 2014-07-25 DIAGNOSIS — K625 Hemorrhage of anus and rectum: Secondary | ICD-10-CM | POA: Diagnosis not present

## 2014-07-25 DIAGNOSIS — Z85038 Personal history of other malignant neoplasm of large intestine: Secondary | ICD-10-CM | POA: Diagnosis not present

## 2014-08-01 DIAGNOSIS — Z923 Personal history of irradiation: Secondary | ICD-10-CM | POA: Diagnosis not present

## 2014-08-01 DIAGNOSIS — M419 Scoliosis, unspecified: Secondary | ICD-10-CM | POA: Diagnosis not present

## 2014-08-01 DIAGNOSIS — K219 Gastro-esophageal reflux disease without esophagitis: Secondary | ICD-10-CM | POA: Diagnosis not present

## 2014-08-01 DIAGNOSIS — E039 Hypothyroidism, unspecified: Secondary | ICD-10-CM | POA: Diagnosis not present

## 2014-08-01 DIAGNOSIS — Z85038 Personal history of other malignant neoplasm of large intestine: Secondary | ICD-10-CM | POA: Diagnosis not present

## 2014-08-01 DIAGNOSIS — K625 Hemorrhage of anus and rectum: Secondary | ICD-10-CM | POA: Diagnosis not present

## 2014-08-01 DIAGNOSIS — Z9221 Personal history of antineoplastic chemotherapy: Secondary | ICD-10-CM | POA: Diagnosis not present

## 2014-08-01 DIAGNOSIS — I10 Essential (primary) hypertension: Secondary | ICD-10-CM | POA: Diagnosis not present

## 2014-08-01 DIAGNOSIS — Z79899 Other long term (current) drug therapy: Secondary | ICD-10-CM | POA: Diagnosis not present

## 2014-08-01 DIAGNOSIS — Z91048 Other nonmedicinal substance allergy status: Secondary | ICD-10-CM | POA: Diagnosis not present

## 2014-08-24 DIAGNOSIS — K625 Hemorrhage of anus and rectum: Secondary | ICD-10-CM | POA: Diagnosis not present

## 2014-09-02 DIAGNOSIS — M249 Joint derangement, unspecified: Secondary | ICD-10-CM | POA: Diagnosis not present

## 2014-09-02 DIAGNOSIS — M1310 Monoarthritis, not elsewhere classified, unspecified site: Secondary | ICD-10-CM | POA: Diagnosis not present

## 2014-09-02 DIAGNOSIS — M19041 Primary osteoarthritis, right hand: Secondary | ICD-10-CM | POA: Diagnosis not present

## 2014-09-02 DIAGNOSIS — M7989 Other specified soft tissue disorders: Secondary | ICD-10-CM | POA: Diagnosis not present

## 2014-09-06 DIAGNOSIS — I7090 Unspecified atherosclerosis: Secondary | ICD-10-CM | POA: Diagnosis not present

## 2014-09-06 DIAGNOSIS — M419 Scoliosis, unspecified: Secondary | ICD-10-CM | POA: Diagnosis not present

## 2014-09-06 DIAGNOSIS — C20 Malignant neoplasm of rectum: Secondary | ICD-10-CM | POA: Diagnosis not present

## 2014-09-07 DIAGNOSIS — C20 Malignant neoplasm of rectum: Secondary | ICD-10-CM | POA: Diagnosis not present

## 2014-09-07 DIAGNOSIS — E876 Hypokalemia: Secondary | ICD-10-CM | POA: Diagnosis not present

## 2014-09-21 DIAGNOSIS — M1A00X0 Idiopathic chronic gout, unspecified site, without tophus (tophi): Secondary | ICD-10-CM | POA: Diagnosis not present

## 2014-09-21 DIAGNOSIS — E782 Mixed hyperlipidemia: Secondary | ICD-10-CM | POA: Diagnosis not present

## 2014-09-29 DIAGNOSIS — E782 Mixed hyperlipidemia: Secondary | ICD-10-CM | POA: Diagnosis not present

## 2014-09-29 DIAGNOSIS — C20 Malignant neoplasm of rectum: Secondary | ICD-10-CM | POA: Diagnosis not present

## 2014-09-29 DIAGNOSIS — M1A00X0 Idiopathic chronic gout, unspecified site, without tophus (tophi): Secondary | ICD-10-CM | POA: Diagnosis not present

## 2014-09-29 DIAGNOSIS — M545 Low back pain: Secondary | ICD-10-CM | POA: Diagnosis not present

## 2014-09-29 DIAGNOSIS — J209 Acute bronchitis, unspecified: Secondary | ICD-10-CM | POA: Diagnosis not present

## 2014-09-29 DIAGNOSIS — E039 Hypothyroidism, unspecified: Secondary | ICD-10-CM | POA: Diagnosis not present

## 2014-09-29 DIAGNOSIS — I10 Essential (primary) hypertension: Secondary | ICD-10-CM | POA: Diagnosis not present

## 2014-10-17 NOTE — Patient Outreach (Signed)
[facility]) Care Management  10/17/2014  Michael Baldwin April 21, 1933 [id number]   Referral with MD notes received from MD office, [name] assigned for telephone outreach.  [name]. [name], [name] Management [place] Assistant Phone: [phone] Fax: [phone]

## 2014-10-20 ENCOUNTER — Other Ambulatory Visit: Payer: Self-pay | Admitting: *Deleted

## 2014-10-20 DIAGNOSIS — M659 Synovitis and tenosynovitis, unspecified: Secondary | ICD-10-CM | POA: Diagnosis not present

## 2014-10-20 NOTE — Patient Outreach (Signed)
[place] [facility]) Care Management  10/20/2014  Michael Baldwin 12/23/1932 [id number]   Unsuccessful telephone call to patient regarding primary MD referral to [facility] services. HIPPA compliant message left, await return call or will attempt telephone outreach on Monday, June 6.  Joylene Draft, RN, [place] Care Management [phone]

## 2014-10-24 ENCOUNTER — Other Ambulatory Visit: Payer: Self-pay | Admitting: *Deleted

## 2014-10-24 NOTE — Patient Outreach (Signed)
[place] [facility]) Care Management  10/24/2014  Michael Baldwin 1932-08-19 [id number]   Telephone call to patient , his wife answered the telephone, states that [name] does not hear well over the phone but is doing well otherwise ,Obtained two HIPPA compliant identifiers . Discussed [facility] Care Management and offered services. Per patient &  wife Michael Baldwin they are not interested nor do they  have a need  in receiving [facility] Care Management services at this time.  Plan : Will notify Lurline Del, CMA that the patient declines need for [facility] services at this time.  Joylene Draft, RN, [place] Care Management [phone]

## 2014-10-25 ENCOUNTER — Encounter: Payer: Self-pay | Admitting: *Deleted

## 2014-10-26 DIAGNOSIS — I10 Essential (primary) hypertension: Secondary | ICD-10-CM | POA: Diagnosis not present

## 2014-10-30 NOTE — Patient Outreach (Signed)
[place] [facility]) Care Management  10/30/2014  Michael Baldwin 11-20-1932 [id number]   Notification from [name], RN to close case as patient declined to participate with [place] Management services.  Michael Baldwin. [place], [place] Management [place] Assistant Phone: [phone] Fax: [phone]

## 2014-11-10 DIAGNOSIS — M659 Synovitis and tenosynovitis, unspecified: Secondary | ICD-10-CM | POA: Diagnosis not present

## 2014-11-23 DIAGNOSIS — K625 Hemorrhage of anus and rectum: Secondary | ICD-10-CM | POA: Diagnosis not present

## 2014-11-23 DIAGNOSIS — Z85048 Personal history of other malignant neoplasm of rectum, rectosigmoid junction, and anus: Secondary | ICD-10-CM | POA: Diagnosis not present

## 2014-11-30 DIAGNOSIS — E039 Hypothyroidism, unspecified: Secondary | ICD-10-CM | POA: Diagnosis not present

## 2014-11-30 DIAGNOSIS — Z85048 Personal history of other malignant neoplasm of rectum, rectosigmoid junction, and anus: Secondary | ICD-10-CM | POA: Diagnosis not present

## 2014-11-30 DIAGNOSIS — Z923 Personal history of irradiation: Secondary | ICD-10-CM | POA: Diagnosis not present

## 2014-11-30 DIAGNOSIS — K625 Hemorrhage of anus and rectum: Secondary | ICD-10-CM | POA: Diagnosis not present

## 2014-11-30 DIAGNOSIS — Z9221 Personal history of antineoplastic chemotherapy: Secondary | ICD-10-CM | POA: Diagnosis not present

## 2014-11-30 DIAGNOSIS — Z91048 Other nonmedicinal substance allergy status: Secondary | ICD-10-CM | POA: Diagnosis not present

## 2014-11-30 DIAGNOSIS — K219 Gastro-esophageal reflux disease without esophagitis: Secondary | ICD-10-CM | POA: Diagnosis not present

## 2014-11-30 DIAGNOSIS — Z79899 Other long term (current) drug therapy: Secondary | ICD-10-CM | POA: Diagnosis not present

## 2014-11-30 DIAGNOSIS — I10 Essential (primary) hypertension: Secondary | ICD-10-CM | POA: Diagnosis not present

## 2014-12-16 DIAGNOSIS — T2112XA Burn of first degree of abdominal wall, initial encounter: Secondary | ICD-10-CM | POA: Diagnosis not present

## 2015-01-22 DIAGNOSIS — T7840XA Allergy, unspecified, initial encounter: Secondary | ICD-10-CM | POA: Diagnosis not present

## 2015-01-22 DIAGNOSIS — R079 Chest pain, unspecified: Secondary | ICD-10-CM | POA: Diagnosis not present

## 2015-01-23 DIAGNOSIS — H9190 Unspecified hearing loss, unspecified ear: Secondary | ICD-10-CM | POA: Diagnosis not present

## 2015-01-23 DIAGNOSIS — L509 Urticaria, unspecified: Secondary | ICD-10-CM | POA: Diagnosis not present

## 2015-01-23 DIAGNOSIS — M1990 Unspecified osteoarthritis, unspecified site: Secondary | ICD-10-CM | POA: Diagnosis not present

## 2015-01-23 DIAGNOSIS — I10 Essential (primary) hypertension: Secondary | ICD-10-CM | POA: Diagnosis not present

## 2015-01-23 DIAGNOSIS — T7840XA Allergy, unspecified, initial encounter: Secondary | ICD-10-CM | POA: Diagnosis not present

## 2015-01-23 DIAGNOSIS — Z85048 Personal history of other malignant neoplasm of rectum, rectosigmoid junction, and anus: Secondary | ICD-10-CM | POA: Diagnosis not present

## 2015-01-23 DIAGNOSIS — E039 Hypothyroidism, unspecified: Secondary | ICD-10-CM | POA: Diagnosis not present

## 2015-01-23 DIAGNOSIS — Z9109 Other allergy status, other than to drugs and biological substances: Secondary | ICD-10-CM | POA: Diagnosis not present

## 2015-01-23 DIAGNOSIS — R079 Chest pain, unspecified: Secondary | ICD-10-CM | POA: Diagnosis not present

## 2015-01-23 DIAGNOSIS — M4135 Thoracogenic scoliosis, thoracolumbar region: Secondary | ICD-10-CM | POA: Diagnosis not present

## 2015-01-24 DIAGNOSIS — R079 Chest pain, unspecified: Secondary | ICD-10-CM | POA: Diagnosis not present

## 2015-01-31 DIAGNOSIS — Z23 Encounter for immunization: Secondary | ICD-10-CM | POA: Diagnosis not present

## 2015-02-23 DIAGNOSIS — L500 Allergic urticaria: Secondary | ICD-10-CM | POA: Diagnosis not present

## 2015-02-28 DIAGNOSIS — K625 Hemorrhage of anus and rectum: Secondary | ICD-10-CM | POA: Diagnosis not present

## 2015-02-28 DIAGNOSIS — C20 Malignant neoplasm of rectum: Secondary | ICD-10-CM | POA: Diagnosis not present

## 2015-03-08 DIAGNOSIS — N3289 Other specified disorders of bladder: Secondary | ICD-10-CM | POA: Diagnosis not present

## 2015-03-08 DIAGNOSIS — C20 Malignant neoplasm of rectum: Secondary | ICD-10-CM | POA: Diagnosis not present

## 2015-03-09 DIAGNOSIS — C20 Malignant neoplasm of rectum: Secondary | ICD-10-CM | POA: Diagnosis not present

## 2015-03-09 DIAGNOSIS — R5382 Chronic fatigue, unspecified: Secondary | ICD-10-CM | POA: Diagnosis not present

## 2015-05-22 DIAGNOSIS — K219 Gastro-esophageal reflux disease without esophagitis: Secondary | ICD-10-CM | POA: Diagnosis not present

## 2015-05-22 DIAGNOSIS — E039 Hypothyroidism, unspecified: Secondary | ICD-10-CM | POA: Diagnosis not present

## 2015-05-22 DIAGNOSIS — M419 Scoliosis, unspecified: Secondary | ICD-10-CM | POA: Diagnosis not present

## 2015-05-22 DIAGNOSIS — Z85048 Personal history of other malignant neoplasm of rectum, rectosigmoid junction, and anus: Secondary | ICD-10-CM | POA: Diagnosis not present

## 2015-05-22 DIAGNOSIS — I10 Essential (primary) hypertension: Secondary | ICD-10-CM | POA: Diagnosis not present

## 2015-05-22 DIAGNOSIS — K625 Hemorrhage of anus and rectum: Secondary | ICD-10-CM | POA: Diagnosis not present

## 2015-05-22 DIAGNOSIS — Z91048 Other nonmedicinal substance allergy status: Secondary | ICD-10-CM | POA: Diagnosis not present

## 2015-05-22 DIAGNOSIS — Z923 Personal history of irradiation: Secondary | ICD-10-CM | POA: Diagnosis not present

## 2015-05-22 DIAGNOSIS — Z9221 Personal history of antineoplastic chemotherapy: Secondary | ICD-10-CM | POA: Diagnosis not present

## 2015-05-22 DIAGNOSIS — Z79899 Other long term (current) drug therapy: Secondary | ICD-10-CM | POA: Diagnosis not present

## 2015-06-13 DIAGNOSIS — M1990 Unspecified osteoarthritis, unspecified site: Secondary | ICD-10-CM | POA: Diagnosis not present

## 2015-06-13 DIAGNOSIS — I10 Essential (primary) hypertension: Secondary | ICD-10-CM | POA: Diagnosis not present

## 2015-06-13 DIAGNOSIS — C20 Malignant neoplasm of rectum: Secondary | ICD-10-CM | POA: Diagnosis not present

## 2015-06-13 DIAGNOSIS — E782 Mixed hyperlipidemia: Secondary | ICD-10-CM | POA: Diagnosis not present

## 2015-06-13 DIAGNOSIS — E039 Hypothyroidism, unspecified: Secondary | ICD-10-CM | POA: Diagnosis not present

## 2015-06-13 DIAGNOSIS — M1A00X0 Idiopathic chronic gout, unspecified site, without tophus (tophi): Secondary | ICD-10-CM | POA: Diagnosis not present

## 2015-06-20 DIAGNOSIS — E782 Mixed hyperlipidemia: Secondary | ICD-10-CM | POA: Diagnosis not present

## 2015-06-20 DIAGNOSIS — E039 Hypothyroidism, unspecified: Secondary | ICD-10-CM | POA: Diagnosis not present

## 2015-06-20 DIAGNOSIS — Q675 Congenital deformity of spine: Secondary | ICD-10-CM | POA: Diagnosis not present

## 2015-06-20 DIAGNOSIS — C20 Malignant neoplasm of rectum: Secondary | ICD-10-CM | POA: Diagnosis not present

## 2015-06-20 DIAGNOSIS — Z0001 Encounter for general adult medical examination with abnormal findings: Secondary | ICD-10-CM | POA: Diagnosis not present

## 2015-06-20 DIAGNOSIS — I10 Essential (primary) hypertension: Secondary | ICD-10-CM | POA: Diagnosis not present

## 2015-06-20 DIAGNOSIS — M1A00X0 Idiopathic chronic gout, unspecified site, without tophus (tophi): Secondary | ICD-10-CM | POA: Diagnosis not present

## 2015-06-20 DIAGNOSIS — M545 Low back pain: Secondary | ICD-10-CM | POA: Diagnosis not present

## 2015-07-16 DIAGNOSIS — M545 Low back pain: Secondary | ICD-10-CM | POA: Diagnosis not present

## 2015-07-18 DIAGNOSIS — S0502XA Injury of conjunctiva and corneal abrasion without foreign body, left eye, initial encounter: Secondary | ICD-10-CM | POA: Diagnosis not present

## 2015-08-16 DIAGNOSIS — Z85048 Personal history of other malignant neoplasm of rectum, rectosigmoid junction, and anus: Secondary | ICD-10-CM | POA: Diagnosis not present

## 2015-09-07 DIAGNOSIS — Z9889 Other specified postprocedural states: Secondary | ICD-10-CM | POA: Diagnosis not present

## 2015-09-07 DIAGNOSIS — C20 Malignant neoplasm of rectum: Secondary | ICD-10-CM | POA: Diagnosis not present

## 2015-09-07 DIAGNOSIS — Z923 Personal history of irradiation: Secondary | ICD-10-CM | POA: Diagnosis not present

## 2015-09-07 DIAGNOSIS — Z9221 Personal history of antineoplastic chemotherapy: Secondary | ICD-10-CM | POA: Diagnosis not present

## 2015-11-06 DIAGNOSIS — C20 Malignant neoplasm of rectum: Secondary | ICD-10-CM | POA: Diagnosis not present

## 2015-12-21 DIAGNOSIS — I10 Essential (primary) hypertension: Secondary | ICD-10-CM | POA: Diagnosis not present

## 2015-12-21 DIAGNOSIS — M1990 Unspecified osteoarthritis, unspecified site: Secondary | ICD-10-CM | POA: Diagnosis not present

## 2015-12-25 DIAGNOSIS — E039 Hypothyroidism, unspecified: Secondary | ICD-10-CM | POA: Diagnosis not present

## 2015-12-25 DIAGNOSIS — M545 Low back pain: Secondary | ICD-10-CM | POA: Diagnosis not present

## 2015-12-25 DIAGNOSIS — I10 Essential (primary) hypertension: Secondary | ICD-10-CM | POA: Diagnosis not present

## 2015-12-25 DIAGNOSIS — M1A00X0 Idiopathic chronic gout, unspecified site, without tophus (tophi): Secondary | ICD-10-CM | POA: Diagnosis not present

## 2015-12-25 DIAGNOSIS — Q675 Congenital deformity of spine: Secondary | ICD-10-CM | POA: Diagnosis not present

## 2015-12-25 DIAGNOSIS — E782 Mixed hyperlipidemia: Secondary | ICD-10-CM | POA: Diagnosis not present

## 2015-12-25 DIAGNOSIS — Z6820 Body mass index (BMI) 20.0-20.9, adult: Secondary | ICD-10-CM | POA: Diagnosis not present

## 2015-12-25 DIAGNOSIS — C20 Malignant neoplasm of rectum: Secondary | ICD-10-CM | POA: Diagnosis not present

## 2016-01-02 DIAGNOSIS — Z6820 Body mass index (BMI) 20.0-20.9, adult: Secondary | ICD-10-CM | POA: Diagnosis not present

## 2016-01-02 DIAGNOSIS — L03031 Cellulitis of right toe: Secondary | ICD-10-CM | POA: Diagnosis not present

## 2016-01-18 DIAGNOSIS — L820 Inflamed seborrheic keratosis: Secondary | ICD-10-CM | POA: Diagnosis not present

## 2016-02-05 DIAGNOSIS — Z85038 Personal history of other malignant neoplasm of large intestine: Secondary | ICD-10-CM | POA: Diagnosis not present

## 2016-02-05 DIAGNOSIS — K625 Hemorrhage of anus and rectum: Secondary | ICD-10-CM | POA: Diagnosis not present

## 2016-02-12 DIAGNOSIS — Z85038 Personal history of other malignant neoplasm of large intestine: Secondary | ICD-10-CM | POA: Diagnosis not present

## 2016-02-12 DIAGNOSIS — K625 Hemorrhage of anus and rectum: Secondary | ICD-10-CM | POA: Diagnosis not present

## 2016-02-12 DIAGNOSIS — N3289 Other specified disorders of bladder: Secondary | ICD-10-CM | POA: Diagnosis not present

## 2016-02-12 DIAGNOSIS — K5730 Diverticulosis of large intestine without perforation or abscess without bleeding: Secondary | ICD-10-CM | POA: Diagnosis not present

## 2016-02-26 DIAGNOSIS — H35363 Drusen (degenerative) of macula, bilateral: Secondary | ICD-10-CM | POA: Diagnosis not present

## 2016-03-05 ENCOUNTER — Encounter (HOSPITAL_COMMUNITY): Payer: Self-pay | Admitting: Hematology

## 2016-03-05 ENCOUNTER — Encounter (HOSPITAL_COMMUNITY): Payer: Medicare Other | Attending: Hematology | Admitting: Hematology

## 2016-03-05 ENCOUNTER — Encounter (HOSPITAL_COMMUNITY): Payer: Medicare Other

## 2016-03-05 VITALS — BP 133/72 | HR 63 | Temp 98.2°F | Resp 18 | Ht 69.0 in | Wt 135.0 lb

## 2016-03-05 DIAGNOSIS — K5790 Diverticulosis of intestine, part unspecified, without perforation or abscess without bleeding: Secondary | ICD-10-CM | POA: Diagnosis not present

## 2016-03-05 DIAGNOSIS — K625 Hemorrhage of anus and rectum: Secondary | ICD-10-CM | POA: Diagnosis not present

## 2016-03-05 DIAGNOSIS — Z87891 Personal history of nicotine dependence: Secondary | ICD-10-CM | POA: Diagnosis not present

## 2016-03-05 DIAGNOSIS — C20 Malignant neoplasm of rectum: Secondary | ICD-10-CM | POA: Insufficient documentation

## 2016-03-05 LAB — COMPREHENSIVE METABOLIC PANEL
ALT: 17 U/L (ref 17–63)
AST: 21 U/L (ref 15–41)
Albumin: 4.7 g/dL (ref 3.5–5.0)
Alkaline Phosphatase: 46 U/L (ref 38–126)
Anion gap: 7 (ref 5–15)
BILIRUBIN TOTAL: 1.1 mg/dL (ref 0.3–1.2)
BUN: 17 mg/dL (ref 6–20)
CO2: 29 mmol/L (ref 22–32)
CREATININE: 0.94 mg/dL (ref 0.61–1.24)
Calcium: 9.5 mg/dL (ref 8.9–10.3)
Chloride: 101 mmol/L (ref 101–111)
GFR calc Af Amer: >60 mL/min (ref >60)
GFR calc non Af Amer: >60 mL/min (ref >60)
GLUCOSE: 100 mg/dL — AB (ref 65–99)
Potassium: 4.3 mmol/L (ref 3.5–5.1)
Sodium: 137 mmol/L (ref 135–145)
TOTAL PROTEIN: 7.6 g/dL (ref 6.5–8.1)

## 2016-03-05 LAB — CBC WITH DIFFERENTIAL/PLATELET
BASOS ABS: 0 10*3/uL (ref 0.0–0.1)
Basophils Relative: 0 %
Eosinophils Absolute: 0 10*3/uL (ref 0.0–0.7)
Eosinophils Relative: 1 %
HEMATOCRIT: 45 % (ref 39.0–52.0)
HEMOGLOBIN: 14.8 g/dL (ref 13.0–17.0)
LYMPHS PCT: 28 %
Lymphs Abs: 1.5 10*3/uL (ref 0.7–4.0)
MCH: 29.9 pg (ref 26.0–34.0)
MCHC: 32.9 g/dL (ref 30.0–36.0)
MCV: 90.9 fL (ref 78.0–100.0)
Monocytes Absolute: 0.4 10*3/uL (ref 0.1–1.0)
Monocytes Relative: 8 %
NEUTROS PCT: 63 %
Neutro Abs: 3.3 10*3/uL (ref 1.7–7.7)
Platelets: 158 10*3/uL (ref 150–400)
RBC: 4.95 MIL/uL (ref 4.22–5.81)
RDW: 13.1 % (ref 11.5–15.5)
WBC: 5.2 10*3/uL (ref 4.0–10.5)

## 2016-03-05 NOTE — Progress Notes (Signed)
HEMATOLOGY/ONCOLOGY CONSULTATION NOTE  Date of Service: 03/05/2016  Patient Care Team: Caryl Bis, MD as PCP - General (Family Medicine) Dr Anthony Sar MD (Surgery)  CHIEF COMPLAINTS/PURPOSE OF CONSULTATION:  Rectal cancer management  HISTORY OF PRESENTING ILLNESS:   Michael Baldwin is a wonderful 82 y.o. male who has been referred to [place] by Dr .Gar Ponto, MD And Dr Anthony Sar for evaluation and continued management of rectal cancer.  Patient has a history of hypertension, dyslipidemia, hypothyroidism, scoliosis with chronic back pain, osteoarthritis who presented with a lower GI bleed on 12/01/2013. He underwent a colonoscopy and was found to have a polyp in the rectum which was removed in pieces and as per outside records was noted to have frank invasive well-differentiated rectal adenocarcinoma. His CEA level initially was noted to be 1.2. He had an endoscopic ultrasound by Dr. Ardis Hughs on 12/22/2013 which confirmed malignant appearing mass in the posterior rectum which was nonobstructive and occupied 20% of the lumen with the distal edge about 8 cm from the anal verge. The mass was noted to be about 2.8 cm, heterogenous with invasion into but not through the muscularis propria and was staged as cT2,N0. CT of the abdomen and pelvis showed no evidence of metastases. He was treated with Xeloda chemotherapy with concurrent definitive RT. he had brief interruption of her Xeloda chemotherapy due to severe diarrhea and dehydration. He was deemed not to be a surgical candidate due to his delicate overall health.  Patient has been following with Dr Jacquiline Doe (last seen in April 2017) and Dr Anthony Sar (last seen in September 2017).  Patient is accompanied by his wife . He is very hard of hearing she helps him answer most questions . He notes some intermittent minimal rectal bleeding. He had a sigmoidoscopy in January 2017 which is wife notes did not show any signs of cancer recurrence .  he notes he  has a little bit of blood only on the tissue paper but no other significant bleeding. Notes it's more of a bother when he has hard stools. Uses stool softeners as needed.   He is scheduled for a follow-up colonoscopy at 2 years in December 2017 with Dr. Anthony Sar. Last CEA WNL in 08/2015 though I'm not sure how well this tracks with his disease state given that was normal initially.  Patient notes no weight loss. Otherwise feels well. Is on chronic lyrica due to radiculopathic pain from his scoliosis.      MEDICAL HISTORY:  Past Medical History:  Diagnosis Date  . Cataract, right 2005  . GERD (gastroesophageal reflux disease)   . H/O hiatal hernia 1996  . Hypothyroidism   . Osteoarthritis 2000  . Spinal disorder    scholeosis  Scoliosis with chronic radicular pain on Lyrica.  SURGICAL HISTORY: Past Surgical History:  Procedure Laterality Date  . CATARACT EXTRACTION W/ INTRAOCULAR LENS IMPLANT  2015  . EUS N/A 12/22/2013   Procedure: LOWER ENDOSCOPIC ULTRASOUND (EUS);  Surgeon: Milus Banister, MD;  Location: [facility];  Service: Endoscopy;  Laterality: N/A;  . HERNIA REPAIR  1996    SOCIAL HISTORY: Married lives with his wife. Previously chewed tobacco. He quit at age 55. 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 Tobacco smoking no alcohol no drugs  FAMILY HISTORY: History reviewed. No pertinent family history.  ALLERGIES:  is allergic to tape.  MEDICATIONS:  Current Outpatient Prescriptions  Medication Sig Dispense Refill  . diphenhydrAMINE (BENADRYL) 50 MG  tablet Take 50 mg by mouth as needed for itching.    . cyclobenzaprine (FLEXERIL) 10 MG tablet Take 10 mg by mouth 3 (three) times daily as needed for muscle spasms.    . hydrochlorothiazide (HYDRODIURIL) 25 MG tablet Take 25 mg by mouth daily.    Marland Kitchen levothyroxine (SYNTHROID, LEVOTHROID) 112 MCG tablet Take 112 mcg by mouth daily before breakfast.    . Multiple Vitamins-Minerals  (MULTIVITAMIN WITH IRON-MINERALS) liquid Take by mouth daily.    . pregabalin (LYRICA) 50 MG capsule Take 50 mg by mouth 3 (three) times daily.    . ranitidine (ZANTAC) 150 MG capsule Take 150 mg by mouth 2 (two) times daily.    . simethicone (MYLICON) 0000000 MG chewable tablet Chew 125 mg by mouth every 6 (six) hours as needed for flatulence.    . traMADol-acetaminophen (ULTRACET) 37.5-325 MG per tablet Take 1 tablet by mouth every 6 (six) hours as needed for moderate pain.     No current facility-administered medications for this visit.     REVIEW OF SYSTEMS:    10 Point review of Systems was done is negative except as noted above.  PHYSICAL EXAMINATION: ECOG PERFORMANCE STATUS: 2 - Symptomatic, <50% confined to bed  . Vitals:   03/05/16 1034  BP: 133/72  Pulse: 63  Resp: 18  Temp: 98.2 F (36.8 C)   Filed Weights   03/05/16 1034  Weight: 135 lb (61.2 kg)   .Body mass index is 19.94 kg/m.  GENERAL:alert, in no acute distress and comfortable SKIN: skin color, texture, turgor are normal, no rashes or significant lesions EYES: normal, conjunctiva are pink and non-injected, sclera clear OROPHARYNX:no exudate, no erythema and lips, buccal mucosa, and tongue normal  NECK: supple, no JVD, thyroid normal size, non-tender, without nodularity LYMPH:  no palpable lymphadenopathy in the cervical, axillary or inguinal LUNGS: clear to auscultation with normal respiratory effort HEART: regular rate & rhythm,  no murmurs and no lower extremity edema ABDOMEN: abdomen soft, non-tender, normoactive bowel sounds  Musculoskeletal: no cyanosis of digits and no clubbing  PSYCH: alert & oriented x 3 with fluent speech NEURO: no focal motor/sensory deficits  LABORATORY DATA:  I have reviewed the data as listed  . CBC Latest Ref Rng & Units 03/05/2016  WBC 4.0 - 10.5 K/uL 5.2  Hemoglobin 13.0 - 17.0 g/dL 14.8  Hematocrit 39.0 - 52.0 % 45.0  Platelets 150 - 400 K/uL 158    .No flowsheet  data found.   RADIOGRAPHIC STUDIES: I have personally reviewed the radiological images as listed and agreed with the findings in the report. No results found.  ASSESSMENT & PLAN:    82 year old Caucasian male with multiple medical comorbidities with  1) History of rectal cancer diagnosed in 2015-  noted to be T2N0 on endoscopic ultrasound. Patient was deemed not to be a good surgical candidate due to his comorbidities and overall performance status.  he was treated with definitive Xeloda chemotherapy with radiation . He has been on close follow-up since then with no clinical evidence of disease recurrence . He was last seen by Dr. Seleta Rhymes in April 2017 and is here for continued follow-up . CEA level in April 2017 was 1.1 . However this was apparently within normal limits even on initial diagnosis which suggests might not be a good marker . Patient had a CT of his abdomen and pelvis on 02/12/2016 by Dr. Burke Keels which showed sigmoid diverticulosis without inflammation . No evidence of recurrent or metastatic disease .  Thickening of the urinary bladder or significantly from radiation .   2) mild intermittent rectal bleeding on tissue paper.  Had a sigmoidoscopy in January 2017 that showed no evidence of cancer recurrence . Could be from radiation proctitis or hemorrhoids . Patient notes that his occurs when he has hard stools .no significant pain or obstructive symptoms at this time . Hemoglobin today stable   3) previous chewed tobacco use quit more than 10 years ago. PLAN -All up with Dr. Anthony Sar for planned to your colonoscopy in December 2017. -We shall get CBC, CMP and CEA level for baseline today. -Counseled and adequate oral intake and nutrition to maintain good physical activity level. -Continue follow-up with primary care physician for management of other medical co-morbidities. -Avoid NSAIDs -CT chest abdomen pelvis results were discussed in detail with the patient. Will  likely need follow-up imaging in 6 months. RTC with Dr Whitney Muse in 26months with cbc, cmp and CEA after his colonoscopy.    All of the patient intake low and was very As questions were answered with apparent satisfaction. The patient knows to call the clinic with any problems, questions or concerns.  I spent 45 minutes counseling the patient face to face. The total time spent in the appointment was 60 minutes and more than 50% was on counseling and direct patient cares.    Sullivan Lone MD [place] AAHIVMS [facility] Hematology/Oncology Physician [facility]  (Office):       [phone] (Work cell):  [phone] (Fax):           [phone]  03/05/2016 10:38 AM

## 2016-03-05 NOTE — Patient Instructions (Addendum)
[facility]  Discharge Instructions:  Seen by MD Kale today.  Labs today.  Follow up with MD Penland in 3 months _______________________________________________________________  Thank you for choosing [facility] at [facility] to provide your oncology and hematology care.  To afford each patient quality time with our providers, please arrive at least 15 minutes before your scheduled appointment.  You need to re-schedule your appointment if you arrive 10 or more minutes late.  We strive to give you quality time with our providers, and arriving late affects you and other patients whose appointments are after yours.  Also, if you no show three or more times for appointments you may be dismissed from the clinic.  Again, thank you for choosing [facility] at [facility]. Our hope is that these requests will allow you access to exceptional care and in a timely manner. _______________________________________________________________  If you have questions after your visit, please contact our office at [phone] between the hours of 8:30 a.m. and 5:00 p.m. Voicemails left after 4:30 p.m. will not be returned until the following business day. _______________________________________________________________  For prescription refill requests, have your pharmacy contact our office. _______________________________________________________________  Recommendations made by the consultant and any test results will be sent to your referring physician. _______________________________________________________________ 

## 2016-03-06 LAB — CEA: CEA: 1.7 ng/mL (ref 0.0–4.7)

## 2016-03-14 DIAGNOSIS — Z23 Encounter for immunization: Secondary | ICD-10-CM | POA: Diagnosis not present

## 2016-04-17 DIAGNOSIS — H25812 Combined forms of age-related cataract, left eye: Secondary | ICD-10-CM | POA: Diagnosis not present

## 2016-04-17 DIAGNOSIS — Z961 Presence of intraocular lens: Secondary | ICD-10-CM | POA: Diagnosis not present

## 2016-04-23 DIAGNOSIS — K625 Hemorrhage of anus and rectum: Secondary | ICD-10-CM | POA: Diagnosis not present

## 2016-04-23 DIAGNOSIS — Z85048 Personal history of other malignant neoplasm of rectum, rectosigmoid junction, and anus: Secondary | ICD-10-CM | POA: Diagnosis not present

## 2016-05-01 DIAGNOSIS — Z79899 Other long term (current) drug therapy: Secondary | ICD-10-CM | POA: Diagnosis not present

## 2016-05-01 DIAGNOSIS — Z923 Personal history of irradiation: Secondary | ICD-10-CM | POA: Diagnosis not present

## 2016-05-01 DIAGNOSIS — K625 Hemorrhage of anus and rectum: Secondary | ICD-10-CM | POA: Diagnosis not present

## 2016-05-01 DIAGNOSIS — I10 Essential (primary) hypertension: Secondary | ICD-10-CM | POA: Diagnosis not present

## 2016-05-01 DIAGNOSIS — Z9221 Personal history of antineoplastic chemotherapy: Secondary | ICD-10-CM | POA: Diagnosis not present

## 2016-05-01 DIAGNOSIS — D128 Benign neoplasm of rectum: Secondary | ICD-10-CM | POA: Diagnosis not present

## 2016-05-01 DIAGNOSIS — E039 Hypothyroidism, unspecified: Secondary | ICD-10-CM | POA: Diagnosis not present

## 2016-05-01 DIAGNOSIS — Z91048 Other nonmedicinal substance allergy status: Secondary | ICD-10-CM | POA: Diagnosis not present

## 2016-05-01 DIAGNOSIS — Z85048 Personal history of other malignant neoplasm of rectum, rectosigmoid junction, and anus: Secondary | ICD-10-CM | POA: Diagnosis not present

## 2016-05-01 DIAGNOSIS — K219 Gastro-esophageal reflux disease without esophagitis: Secondary | ICD-10-CM | POA: Diagnosis not present

## 2016-05-15 DIAGNOSIS — R897 Abnormal histological findings in specimens from other organs, systems and tissues: Secondary | ICD-10-CM | POA: Diagnosis not present

## 2016-05-16 NOTE — Patient Instructions (Signed)
Your procedure is scheduled on: 05/23/2016  Report to [facility] at  640  AM.  Call this number if you have problems the morning of surgery: [phone]   Do not eat food or drink liquids :After Midnight.      Take these medicines the morning of surgery with A SIP OF WATER: flexaril, benadryl, synthroid, lyrica, zantac, ultracet.   Do not wear jewelry, make-up or nail polish.  Do not wear lotions, powders, or perfumes. You may wear deodorant.  Do not shave 48 hours prior to surgery.  Do not bring valuables to the hospital.  Contacts, dentures or bridgework may not be worn into surgery.  Leave suitcase in the car. After surgery it may be brought to your room.  For patients admitted to the hospital, checkout time is 11:00 AM the day of discharge.   Patients discharged the day of surgery will not be allowed to drive home.  :     Please read over the following fact sheets that you were given: Coughing and Deep Breathing, Surgical Site Infection Prevention, Anesthesia Post-op Instructions and Care and Recovery After Surgery    Cataract A cataract is a clouding of the lens of the eye. When a lens becomes cloudy, vision is reduced based on the degree and nature of the clouding. Many cataracts reduce vision to some degree. Some cataracts make people more near-sighted as they develop. Other cataracts increase glare. Cataracts that are ignored and become worse can sometimes look white. The white color can be seen through the pupil. CAUSES   Aging. However, cataracts may occur at any age, even in newborns.   Certain drugs.   Trauma to the eye.   Certain diseases such as diabetes.   Specific eye diseases such as chronic inflammation inside the eye or a sudden attack of a rare form of glaucoma.   Inherited or acquired medical problems.  SYMPTOMS   Gradual, progressive drop in vision in the affected eye.   Severe, rapid visual loss. This most often happens when trauma is the cause.    DIAGNOSIS  To detect a cataract, an eye doctor examines the lens. Cataracts are best diagnosed with an exam of the eyes with the pupils enlarged (dilated) by drops.  TREATMENT  For an early cataract, vision may improve by using different eyeglasses or stronger lighting. If that does not help your vision, surgery is the only effective treatment. A cataract needs to be surgically removed when vision loss interferes with your everyday activities, such as driving, reading, or watching TV. A cataract may also have to be removed if it prevents examination or treatment of another eye problem. Surgery removes the cloudy lens and usually replaces it with a substitute lens (intraocular lens, IOL).  At a time when both you and your doctor agree, the cataract will be surgically removed. If you have cataracts in both eyes, only one is usually removed at a time. This allows the operated eye to heal and be out of danger from any possible problems after surgery (such as infection or poor wound healing). In rare cases, a cataract may be doing damage to your eye. In these cases, your caregiver may advise surgical removal right away. The vast majority of people who have cataract surgery have better vision afterward. HOME CARE INSTRUCTIONS  If you are not planning surgery, you may be asked to do the following:  Use different eyeglasses.   Use stronger or brighter lighting.   Ask your eye doctor  about reducing your medicine dose or changing medicines if it is thought that a medicine caused your cataract. Changing medicines does not make the cataract go away on its own.   Become familiar with your surroundings. Poor vision can lead to injury. Avoid bumping into things on the affected side. You are at a higher risk for tripping or falling.   Exercise extreme care when driving or operating machinery.   Wear sunglasses if you are sensitive to bright light or experiencing problems with glare.  SEEK IMMEDIATE MEDICAL CARE  IF:   You have a worsening or sudden vision loss.   You notice redness, swelling, or increasing pain in the eye.   You have a fever.  Document Released: 05/05/2005 Document Revised: 04/24/2011 Document Reviewed: 12/27/2010 [facility] Patient Information 2012 [email or web address].PATIENT INSTRUCTIONS POST-ANESTHESIA  IMMEDIATELY FOLLOWING SURGERY:  Do not drive or operate machinery for the first twenty four hours after surgery.  Do not make any important decisions for twenty four hours after surgery or while taking narcotic pain medications or sedatives.  If you develop intractable nausea and vomiting or a severe headache please notify your doctor immediately.  FOLLOW-UP:  Please make an appointment with your surgeon as instructed. You do not need to follow up with anesthesia unless specifically instructed to do so.  WOUND CARE INSTRUCTIONS (if applicable):  Keep a dry clean dressing on the anesthesia/puncture wound site if there is drainage.  Once the wound has quit draining you may leave it open to air.  Generally you should leave the bandage intact for twenty four hours unless there is drainage.  If the epidural site drains for more than 36-48 hours please call the anesthesia department.  QUESTIONS?:  Please feel free to call your physician or the hospital operator if you have any questions, and they will be happy to assist you.

## 2016-05-21 ENCOUNTER — Encounter (HOSPITAL_COMMUNITY)
Admission: RE | Admit: 2016-05-21 | Discharge: 2016-05-21 | Disposition: A | Discharge status: Home / Self Care | Payer: Medicare Other | Source: Ambulatory Visit | Attending: Ophthalmology | Admitting: Ophthalmology

## 2016-05-21 ENCOUNTER — Encounter (HOSPITAL_COMMUNITY): Payer: Self-pay

## 2016-05-21 DIAGNOSIS — Z01818 Encounter for other preprocedural examination: Secondary | ICD-10-CM | POA: Insufficient documentation

## 2016-05-21 DIAGNOSIS — R9431 Abnormal electrocardiogram [ECG] [EKG]: Secondary | ICD-10-CM | POA: Diagnosis not present

## 2016-05-21 DIAGNOSIS — Z01812 Encounter for preprocedural laboratory examination: Secondary | ICD-10-CM | POA: Diagnosis not present

## 2016-05-21 DIAGNOSIS — H2512 Age-related nuclear cataract, left eye: Secondary | ICD-10-CM | POA: Diagnosis not present

## 2016-05-21 HISTORY — DX: Unspecified hearing loss, unspecified ear: H91.90

## 2016-05-21 HISTORY — DX: Malignant (primary) neoplasm, unspecified: C80.1

## 2016-05-21 LAB — BASIC METABOLIC PANEL
ANION GAP: 9 (ref 5–15)
BUN: 19 mg/dL (ref 6–20)
CO2: 29 mmol/L (ref 22–32)
Calcium: 9.4 mg/dL (ref 8.9–10.3)
Chloride: 99 mmol/L — ABNORMAL LOW (ref 101–111)
Creatinine, Ser: 0.96 mg/dL (ref 0.61–1.24)
GFR calc Af Amer: >60 mL/min (ref >60)
GFR calc non Af Amer: >60 mL/min (ref >60)
GLUCOSE: 92 mg/dL (ref 65–99)
POTASSIUM: 3.4 mmol/L — AB (ref 3.5–5.1)
Sodium: 137 mmol/L (ref 135–145)

## 2016-05-21 LAB — CBC WITH DIFFERENTIAL/PLATELET
BASOS ABS: 0 10*3/uL (ref 0.0–0.1)
Basophils Relative: 0 %
Eosinophils Absolute: 0 10*3/uL (ref 0.0–0.7)
Eosinophils Relative: 0 %
HEMATOCRIT: 44 % (ref 39.0–52.0)
Hemoglobin: 14.4 g/dL (ref 13.0–17.0)
LYMPHS ABS: 0.9 10*3/uL (ref 0.7–4.0)
LYMPHS PCT: 16 %
MCH: 30.5 pg (ref 26.0–34.0)
MCHC: 32.7 g/dL (ref 30.0–36.0)
MCV: 93.2 fL (ref 78.0–100.0)
MONO ABS: 0.5 10*3/uL (ref 0.1–1.0)
Monocytes Relative: 9 %
NEUTROS ABS: 4.1 10*3/uL (ref 1.7–7.7)
Neutrophils Relative %: 75 %
Platelets: 172 10*3/uL (ref 150–400)
RBC: 4.72 MIL/uL (ref 4.22–5.81)
RDW: 12.8 % (ref 11.5–15.5)
WBC: 5.5 10*3/uL (ref 4.0–10.5)

## 2016-05-27 DIAGNOSIS — Z85048 Personal history of other malignant neoplasm of rectum, rectosigmoid junction, and anus: Secondary | ICD-10-CM | POA: Diagnosis not present

## 2016-05-27 DIAGNOSIS — C20 Malignant neoplasm of rectum: Secondary | ICD-10-CM | POA: Diagnosis not present

## 2016-05-27 DIAGNOSIS — M419 Scoliosis, unspecified: Secondary | ICD-10-CM | POA: Diagnosis not present

## 2016-05-27 DIAGNOSIS — K620 Anal polyp: Secondary | ICD-10-CM | POA: Diagnosis not present

## 2016-05-27 DIAGNOSIS — Z9221 Personal history of antineoplastic chemotherapy: Secondary | ICD-10-CM | POA: Diagnosis not present

## 2016-05-27 DIAGNOSIS — Z79899 Other long term (current) drug therapy: Secondary | ICD-10-CM | POA: Diagnosis not present

## 2016-05-27 DIAGNOSIS — E039 Hypothyroidism, unspecified: Secondary | ICD-10-CM | POA: Diagnosis not present

## 2016-05-27 DIAGNOSIS — I10 Essential (primary) hypertension: Secondary | ICD-10-CM | POA: Diagnosis not present

## 2016-05-27 DIAGNOSIS — Z923 Personal history of irradiation: Secondary | ICD-10-CM | POA: Diagnosis not present

## 2016-05-27 DIAGNOSIS — K219 Gastro-esophageal reflux disease without esophagitis: Secondary | ICD-10-CM | POA: Diagnosis not present

## 2016-05-27 DIAGNOSIS — Z91048 Other nonmedicinal substance allergy status: Secondary | ICD-10-CM | POA: Diagnosis not present

## 2016-05-28 DIAGNOSIS — Z91048 Other nonmedicinal substance allergy status: Secondary | ICD-10-CM | POA: Diagnosis not present

## 2016-05-28 DIAGNOSIS — K621 Rectal polyp: Secondary | ICD-10-CM | POA: Diagnosis not present

## 2016-05-28 DIAGNOSIS — K219 Gastro-esophageal reflux disease without esophagitis: Secondary | ICD-10-CM | POA: Diagnosis not present

## 2016-05-28 DIAGNOSIS — I10 Essential (primary) hypertension: Secondary | ICD-10-CM | POA: Diagnosis not present

## 2016-05-28 DIAGNOSIS — Z85048 Personal history of other malignant neoplasm of rectum, rectosigmoid junction, and anus: Secondary | ICD-10-CM | POA: Diagnosis not present

## 2016-05-28 DIAGNOSIS — C20 Malignant neoplasm of rectum: Secondary | ICD-10-CM | POA: Diagnosis not present

## 2016-05-28 DIAGNOSIS — Z9221 Personal history of antineoplastic chemotherapy: Secondary | ICD-10-CM | POA: Diagnosis not present

## 2016-05-28 DIAGNOSIS — Z79899 Other long term (current) drug therapy: Secondary | ICD-10-CM | POA: Diagnosis not present

## 2016-05-28 DIAGNOSIS — Z923 Personal history of irradiation: Secondary | ICD-10-CM | POA: Diagnosis not present

## 2016-05-28 DIAGNOSIS — M419 Scoliosis, unspecified: Secondary | ICD-10-CM | POA: Diagnosis not present

## 2016-05-28 DIAGNOSIS — E039 Hypothyroidism, unspecified: Secondary | ICD-10-CM | POA: Diagnosis not present

## 2016-05-28 DIAGNOSIS — K620 Anal polyp: Secondary | ICD-10-CM | POA: Diagnosis not present

## 2016-06-02 ENCOUNTER — Encounter (HOSPITAL_COMMUNITY)
Admission: RE | Admit: 2016-06-02 | Discharge: 2016-06-02 | Disposition: A | Discharge status: Home / Self Care | Payer: Medicare Other | Source: Ambulatory Visit | Attending: Ophthalmology | Admitting: Ophthalmology

## 2016-06-02 ENCOUNTER — Encounter (HOSPITAL_COMMUNITY): Payer: Self-pay

## 2016-06-02 HISTORY — DX: Malignant neoplasm of rectum: C20

## 2016-06-03 DIAGNOSIS — C20 Malignant neoplasm of rectum: Secondary | ICD-10-CM | POA: Diagnosis not present

## 2016-06-06 ENCOUNTER — Ambulatory Visit (HOSPITAL_COMMUNITY): Admission: RE | Admit: 2016-06-06 | Payer: Medicare Other | Source: Ambulatory Visit | Admitting: Ophthalmology

## 2016-06-06 ENCOUNTER — Encounter (HOSPITAL_COMMUNITY): Admission: RE | Payer: Self-pay | Source: Ambulatory Visit

## 2016-06-06 ENCOUNTER — Ambulatory Visit (HOSPITAL_COMMUNITY): Payer: Medicare Other | Admitting: Hematology & Oncology

## 2016-06-06 ENCOUNTER — Other Ambulatory Visit (HOSPITAL_COMMUNITY): Payer: Medicare Other

## 2016-06-06 SURGERY — PHACOEMULSIFICATION, CATARACT, WITH IOL INSERTION
Anesthesia: Monitor Anesthesia Care | Laterality: Left

## 2016-06-09 ENCOUNTER — Encounter (HOSPITAL_COMMUNITY): Payer: Medicare Other | Attending: Hematology & Oncology | Admitting: Hematology & Oncology

## 2016-06-09 ENCOUNTER — Encounter (HOSPITAL_COMMUNITY): Payer: Self-pay | Admitting: Hematology & Oncology

## 2016-06-09 ENCOUNTER — Encounter (HOSPITAL_COMMUNITY): Payer: Medicare Other

## 2016-06-09 VITALS — BP 121/82 | HR 87 | Temp 97.7°F | Resp 16 | Wt 139.4 lb

## 2016-06-09 DIAGNOSIS — E039 Hypothyroidism, unspecified: Secondary | ICD-10-CM | POA: Diagnosis not present

## 2016-06-09 DIAGNOSIS — Z79899 Other long term (current) drug therapy: Secondary | ICD-10-CM | POA: Insufficient documentation

## 2016-06-09 DIAGNOSIS — M1990 Unspecified osteoarthritis, unspecified site: Secondary | ICD-10-CM | POA: Diagnosis not present

## 2016-06-09 DIAGNOSIS — Z87891 Personal history of nicotine dependence: Secondary | ICD-10-CM | POA: Diagnosis not present

## 2016-06-09 DIAGNOSIS — H9190 Unspecified hearing loss, unspecified ear: Secondary | ICD-10-CM | POA: Insufficient documentation

## 2016-06-09 DIAGNOSIS — I10 Essential (primary) hypertension: Secondary | ICD-10-CM | POA: Insufficient documentation

## 2016-06-09 DIAGNOSIS — M5410 Radiculopathy, site unspecified: Secondary | ICD-10-CM | POA: Insufficient documentation

## 2016-06-09 DIAGNOSIS — M419 Scoliosis, unspecified: Secondary | ICD-10-CM | POA: Insufficient documentation

## 2016-06-09 DIAGNOSIS — Z923 Personal history of irradiation: Secondary | ICD-10-CM | POA: Diagnosis not present

## 2016-06-09 DIAGNOSIS — G4700 Insomnia, unspecified: Secondary | ICD-10-CM

## 2016-06-09 DIAGNOSIS — R54 Age-related physical debility: Secondary | ICD-10-CM | POA: Diagnosis not present

## 2016-06-09 DIAGNOSIS — Z9221 Personal history of antineoplastic chemotherapy: Secondary | ICD-10-CM | POA: Insufficient documentation

## 2016-06-09 DIAGNOSIS — G8929 Other chronic pain: Secondary | ICD-10-CM | POA: Diagnosis not present

## 2016-06-09 DIAGNOSIS — C20 Malignant neoplasm of rectum: Secondary | ICD-10-CM

## 2016-06-09 DIAGNOSIS — K219 Gastro-esophageal reflux disease without esophagitis: Secondary | ICD-10-CM | POA: Insufficient documentation

## 2016-06-09 DIAGNOSIS — M549 Dorsalgia, unspecified: Secondary | ICD-10-CM | POA: Diagnosis not present

## 2016-06-09 DIAGNOSIS — E785 Hyperlipidemia, unspecified: Secondary | ICD-10-CM | POA: Insufficient documentation

## 2016-06-09 LAB — CBC WITH DIFFERENTIAL/PLATELET
BASOS ABS: 0 10*3/uL (ref 0.0–0.1)
BASOS PCT: 0 %
EOS PCT: 1 %
Eosinophils Absolute: 0.1 10*3/uL (ref 0.0–0.7)
HCT: 43.7 % (ref 39.0–52.0)
Hemoglobin: 14.3 g/dL (ref 13.0–17.0)
Lymphocytes Relative: 28 %
Lymphs Abs: 1.7 10*3/uL (ref 0.7–4.0)
MCH: 29.9 pg (ref 26.0–34.0)
MCHC: 32.7 g/dL (ref 30.0–36.0)
MCV: 91.4 fL (ref 78.0–100.0)
MONO ABS: 0.7 10*3/uL (ref 0.1–1.0)
Monocytes Relative: 11 %
Neutro Abs: 3.7 10*3/uL (ref 1.7–7.7)
Neutrophils Relative %: 60 %
PLATELETS: 189 10*3/uL (ref 150–400)
RBC: 4.78 MIL/uL (ref 4.22–5.81)
RDW: 12.5 % (ref 11.5–15.5)
WBC: 6.2 10*3/uL (ref 4.0–10.5)

## 2016-06-09 LAB — COMPREHENSIVE METABOLIC PANEL
ALBUMIN: 4.2 g/dL (ref 3.5–5.0)
ALK PHOS: 47 U/L (ref 38–126)
ALT: 17 U/L (ref 17–63)
AST: 24 U/L (ref 15–41)
Anion gap: 8 (ref 5–15)
BILIRUBIN TOTAL: 0.7 mg/dL (ref 0.3–1.2)
BUN: 18 mg/dL (ref 6–20)
CALCIUM: 9.5 mg/dL (ref 8.9–10.3)
CO2: 31 mmol/L (ref 22–32)
CREATININE: 0.92 mg/dL (ref 0.61–1.24)
Chloride: 98 mmol/L — ABNORMAL LOW (ref 101–111)
GFR calc Af Amer: >60 mL/min (ref >60)
GFR calc non Af Amer: >60 mL/min (ref >60)
GLUCOSE: 113 mg/dL — AB (ref 65–99)
Potassium: 4.6 mmol/L (ref 3.5–5.1)
Sodium: 137 mmol/L (ref 135–145)
TOTAL PROTEIN: 7.4 g/dL (ref 6.5–8.1)

## 2016-06-09 LAB — RETICULOCYTES
RBC.: 4.78 MIL/uL (ref 4.22–5.81)
RETIC COUNT ABSOLUTE: 57.4 10*3/uL (ref 19.0–186.0)
Retic Ct Pct: 1.2 % (ref 0.4–3.1)

## 2016-06-09 MED ORDER — LORAZEPAM 1 MG PO TABS: ORAL_TABLET | ORAL | 1 refills | Status: AC

## 2016-06-09 NOTE — Progress Notes (Signed)
HEMATOLOGY/ONCOLOGY PROGRESS NOTE  Date of Service: 06/09/2016  Patient Care Team: Caryl Bis, MD as PCP - General (Family Medicine) Dr Anthony Sar MD (Surgery)  CHIEF COMPLAINTS/PURPOSE OF CONSULTATION:  Rectal cancer management  HISTORY OF PRESENTING ILLNESS:   Michael Baldwin is a wonderful 83 y.o. male who has been referred to [place] by Dr .Gar Ponto, MD And Dr Anthony Sar for evaluation and continued management of rectal cancer.  Patient has a history of hypertension, dyslipidemia, hypothyroidism, scoliosis with chronic back pain, osteoarthritis who presented with a lower GI bleed on 12/01/2013. He underwent a colonoscopy and was found to have a polyp in the rectum which was removed in pieces and as per outside records was noted to have frank invasive well-differentiated rectal adenocarcinoma. His CEA level initially was noted to be 1.2. He had an endoscopic ultrasound by Dr. Ardis Hughs on 12/22/2013 which confirmed malignant appearing mass in the posterior rectum which was nonobstructive and occupied 20% of the lumen with the distal edge about 8 cm from the anal verge. The mass was noted to be about 2.8 cm, heterogenous with invasion into but not through the muscularis propria and was staged as cT2,N0. CT of the abdomen and pelvis showed no evidence of metastases. He was treated with Xeloda chemotherapy with concurrent definitive RT. he had brief interruption of her Xeloda chemotherapy due to severe diarrhea and dehydration. He was deemed not to be a surgical candidate due to his delicate overall health.  Patient has been following with Dr Jacquiline Doe (last seen in April 2017) and Dr Anthony Sar (last seen in September 2017). He was initially seen at [facility] by Dr. Irene Limbo on 03/05/2016.  Patient is accompanied by his wife . He is very hard of hearing she helps him answer most questions . He has seen a GI doctor at [facility] in the past. He saw Dr. Sondra Come for radiation.   The patient says he is having a  hard time processing all of the information and all of the procedures. He just underwent repeat anoscopy with Dr. Anthony Sar. Prior anoscopy showed a "polypoid-like lesion in the rectum with biopsies showing dysplasia."  Current procedure noted an irregular area 8 cm from the anal verge, area was biopsied, shoed a minimally invasive well differentiated adenocarcinoma.   He says he has been doing good and feeling alright. He still has frequent bowel movements. He had 6 this morning. It is occasionally diarrhea, but not all of the time. He notes that he has had this problem since finishing XRT.   He has been taking Ativan to help him sleep. He will be getting cataract surgery soon. Denies any other concerns today.  Denies rectal bleeding.   MEDICAL HISTORY:  Past Medical History:  Diagnosis Date  . Cancer [facility])    rectal cancer  . Cataract, right 2005  . GERD (gastroesophageal reflux disease)   . H/O hiatal hernia 1996  . HOH (hard of hearing)   . Hypothyroidism   . Osteoarthritis 2000  . Rectal cancer ([place])   . Spinal disorder    scholeosis  Scoliosis with chronic radicular pain on Lyrica.  SURGICAL HISTORY: Past Surgical History:  Procedure Laterality Date  . CATARACT EXTRACTION W/ INTRAOCULAR LENS IMPLANT  2015  . EUS N/A 12/22/2013   Procedure: LOWER ENDOSCOPIC ULTRASOUND (EUS);  Surgeon: Milus Banister, MD;  Location: [facility];  Service: Endoscopy;  Laterality: N/A;  . HERNIA REPAIR Right 1996    SOCIAL HISTORY: Married lives with his  wife. Previously chewed tobacco. He quit at age 58. 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 Tobacco smoking no alcohol no drugs  FAMILY HISTORY: History reviewed. No pertinent family history.  ALLERGIES:  is allergic to tape.  MEDICATIONS:  Current Outpatient Prescriptions  Medication Sig Dispense Refill  . bismuth subsalicylate (KAOPECTATE) 262 MG/15ML suspension Take 30 mLs by mouth every 6 (six)  hours as needed for diarrhea or loose stools.    . cyclobenzaprine (FLEXERIL) 10 MG tablet Take 10 mg by mouth 3 (three) times daily as needed for muscle spasms.    . diphenhydrAMINE (BENADRYL) 50 MG tablet Take 50 mg by mouth every 8 (eight) hours as needed for itching.     . hydrochlorothiazide (HYDRODIURIL) 25 MG tablet Take 25 mg by mouth daily.    Marland Kitchen levothyroxine (SYNTHROID, LEVOTHROID) 112 MCG tablet Take 112 mcg by mouth daily before breakfast.    . Multiple Vitamin (MULTIVITAMIN WITH MINERALS) TABS tablet Take 1 tablet by mouth daily.    Vladimir Faster Glycol-Propyl Glycol (SYSTANE OP) Apply 1 drop to eye daily as needed (dry eyes).    . pregabalin (LYRICA) 50 MG capsule Take 50 mg by mouth 3 (three) times daily.    . ranitidine (ZANTAC) 150 MG capsule Take 150 mg by mouth daily as needed for heartburn.     . simethicone (MYLICON) 0000000 MG chewable tablet Chew 125 mg by mouth every 6 (six) hours as needed for flatulence.    . traMADol-acetaminophen (ULTRACET) 37.5-325 MG per tablet Take 1-2 tablets by mouth See admin instructions. Takes 1 tablet in the morning and 2 tablets in the evening     No current facility-administered medications for this visit.     REVIEW OF SYSTEMS:   Review of Systems  Constitutional: Negative.   HENT: Positive for hearing loss.   Eyes: Negative.   Respiratory: Negative.   Cardiovascular: Negative.   Gastrointestinal: Positive for diarrhea.       Frequent bowel movements.  Genitourinary: Negative.   Musculoskeletal: Negative.   Skin: Negative.   Neurological: Negative.   Endo/Heme/Allergies: Negative.   Psychiatric/Behavioral: The patient has insomnia.   All other systems reviewed and are negative. 14 Point review of Systems was done is negative except as noted above.   PHYSICAL EXAMINATION: ECOG PERFORMANCE STATUS: 2 - Symptomatic, <50% confined to bed  . Vitals:   06/09/16 0855  BP: 121/82  Pulse: 87  Resp: 16  Temp: 97.7 F (36.5 C)    Filed Weights   06/09/16 0855  Weight: 139 lb 6.4 oz (63.2 kg)   .Body mass index is 20.59 kg/m.  Physical Exam  Constitutional: He is oriented to person, place, and time.  Pt was able to get on exam table with assistance.  Wears glasses. Thin   HENT:  Head: Normocephalic and atraumatic.  Mouth/Throat: Oropharynx is clear and moist.  Eyes: Conjunctivae and EOM are normal. Pupils are equal, round, and reactive to light. No scleral icterus.  Neck: Normal range of motion. Neck supple.  Cardiovascular: Normal rate, regular rhythm and normal heart sounds.   Pulmonary/Chest: Effort normal and breath sounds normal.  Abdominal: Soft. Bowel sounds are normal. He exhibits no distension and no mass. There is no tenderness. There is no rebound and no guarding.  Musculoskeletal: Normal range of motion.  Lymphadenopathy:    He has no cervical adenopathy.  Neurological: He is alert and oriented to person, place, and time. Gait normal.  Skin:  Skin is warm and dry.  Psychiatric: Mood, memory, affect and judgment normal.  Nursing note and vitals reviewed.  LABORATORY DATA:  I have reviewed the data as listed  . CBC Latest Ref Rng & Units 06/09/2016 05/21/2016 03/05/2016  WBC 4.0 - 10.5 K/uL 6.2 5.5 5.2  Hemoglobin 13.0 - 17.0 g/dL 14.3 14.4 14.8  Hematocrit 39.0 - 52.0 % 43.7 44.0 45.0  Platelets 150 - 400 K/uL 189 172 158    . CMP Latest Ref Rng & Units 06/09/2016 05/21/2016 03/05/2016  Glucose 65 - 99 mg/dL 113(H) 92 100(H)  BUN 6 - 20 mg/dL 18 19 17   Creatinine 0.61 - 1.24 mg/dL 0.92 0.96 0.94  Sodium 135 - 145 mmol/L 137 137 137  Potassium 3.5 - 5.1 mmol/L 4.6 3.4(L) 4.3  Chloride 101 - 111 mmol/L 98(L) 99(L) 101  CO2 22 - 32 mmol/L 31 29 29   Calcium 8.9 - 10.3 mg/dL 9.5 9.4 9.5  Total Protein 6.5 - 8.1 g/dL 7.4 - 7.6  Total Bilirubin 0.3 - 1.2 mg/dL 0.7 - 1.1  Alkaline Phos 38 - 126 U/L 47 - 46  AST 15 - 41 U/L 24 - 21  ALT 17 - 63 U/L 17 - 17    PATHOLOGY:               RADIOGRAPHIC STUDIES: I have personally reviewed the radiological images as listed and agreed with the findings in the report.   ASSESSMENT & PLAN:    83 year old Caucasian male with multiple medical comorbidities with  1) History of rectal cancer diagnosed in 2015-  noted to be T2N0 on endoscopic ultrasound. Patient was deemed not to be a good surgical candidate due to his comorbidities and overall performance status.  he was treated with definitive Xeloda chemotherapy with radiation . CEA was WNL on initial diagnosis. Patient had a CT of his abdomen and pelvis on 02/12/2016 by Dr. Burke Keels which showed sigmoid diverticulosis without inflammation Thickening of the urinary bladder or significantly from radiation  Recent anoscopy with biopsy showed minimally invasive adenocarcinoma.  I have recommended presenting the patient at GI tumor board. This was discussed with Dr. Anthony Sar.  Labs were reviewed with the patient. Results are noted above.  Consider repeat imaging.   3) previous chewed tobacco use quit more than 10 years ago. 4) Frailty  PLAN Refill Ativan.  He will return for a follow up after he is presented at the tumor board, in about 2 weeks.   All questions were answered with apparent satisfaction. The patient knows to call the clinic with any problems, questions or concerns.  This document serves as a record of services personally performed by Ancil Linsey, MD. It was created on her behalf by Martinique Casey, a trained medical scribe. The creation of this record is based on the scribe's personal observations and the provider's statements to them. This document has been checked and approved by the attending provider.  I have reviewed the above documentation for accuracy and completeness and I agree with the above.  06/09/2016 9:22 AM

## 2016-06-09 NOTE — Patient Instructions (Signed)
[place] at [facility] Discharge Instructions  RECOMMENDATIONS MADE BY THE CONSULTANT AND ANY TEST RESULTS WILL BE SENT TO YOUR REFERRING PHYSICIAN.  You were seen today by Dr. Youlanda Roys have been given prescription for ativan A copy of your lab work has been provided We will see you for follow up in 2 weeks.  Thank you for choosing [place] at [facility] to provide your oncology and hematology care.  To afford each patient quality time with our provider, please arrive at least 15 minutes before your scheduled appointment time.    If you have a lab appointment with the [place] please come in thru the  Main Entrance and check in at the main information desk  You need to re-schedule your appointment should you arrive 10 or more minutes late.  We strive to give you quality time with our providers, and arriving late affects you and other patients whose appointments are after yours.  Also, if you no show three or more times for appointments you may be dismissed from the clinic at the providers discretion.     Again, thank you for choosing [facility].  Our hope is that these requests will decrease the amount of time that you wait before being seen by our physicians.       _____________________________________________________________  Should you have questions after your visit to [facility], please contact our office at [phone] between the hours of 8:30 a.m. and 4:30 p.m.  Voicemails left after 4:30 p.m. will not be returned until the following business day.  For prescription refill requests, have your pharmacy contact our office.       Resources For Cancer Patients and their Caregivers ? American Cancer Society: Can assist with transportation, wigs, general needs, runs Look Good Feel Better.        (409)777-8131 ? Cancer Care: Provides financial assistance, online support groups, medication/co-pay  assistance.  1-800-813-HOPE (218)775-4726) ? [employer] Assists [place] cancer patients and their families through emotional , educational and financial support.  317 110 7602 ? [place] DSS Where to apply for food stamps, Medicaid and utility assistance. 765-054-6628 ? [employer]: Transportation to medical appointments. 848-234-5968 ? Social Security Administration: May apply for disability if have a Stage IV cancer. 317-289-1316 4060583547 ? [employer], Disability and Transit Services: Assists with nutrition, care and transit needs. [place] Support Programs: @10RELATIVEDAYS @ > Cancer Support Group  2nd Tuesday of the month 1pm-2pm, Journey Room  > Creative Journey  3rd Tuesday of the month 1130am-1pm, Journey Room  > Look Good Feel Better  1st Wednesday of the month 10am-12 noon, Journey Room (Call [place] to register [phone])

## 2016-06-10 LAB — CEA: CEA: 1.5 ng/mL (ref 0.0–4.7)

## 2016-06-18 ENCOUNTER — Other Ambulatory Visit (HOSPITAL_COMMUNITY): Payer: Medicare Other

## 2016-06-18 ENCOUNTER — Ambulatory Visit (HOSPITAL_COMMUNITY): Payer: Medicare Other | Admitting: Hematology & Oncology

## 2016-06-25 ENCOUNTER — Ambulatory Visit (HOSPITAL_COMMUNITY): Payer: Medicare Other | Admitting: Hematology & Oncology

## 2016-07-03 ENCOUNTER — Encounter (HOSPITAL_COMMUNITY): Payer: Self-pay

## 2016-07-03 ENCOUNTER — Encounter (HOSPITAL_COMMUNITY): Payer: Medicare Other | Attending: Oncology | Admitting: Oncology

## 2016-07-03 VITALS — BP 136/74 | HR 73 | Temp 97.6°F | Resp 18 | Wt 140.2 lb

## 2016-07-03 DIAGNOSIS — M549 Dorsalgia, unspecified: Secondary | ICD-10-CM | POA: Insufficient documentation

## 2016-07-03 DIAGNOSIS — K219 Gastro-esophageal reflux disease without esophagitis: Secondary | ICD-10-CM | POA: Insufficient documentation

## 2016-07-03 DIAGNOSIS — Z79899 Other long term (current) drug therapy: Secondary | ICD-10-CM | POA: Insufficient documentation

## 2016-07-03 DIAGNOSIS — C20 Malignant neoplasm of rectum: Secondary | ICD-10-CM | POA: Diagnosis present

## 2016-07-03 DIAGNOSIS — M5410 Radiculopathy, site unspecified: Secondary | ICD-10-CM | POA: Insufficient documentation

## 2016-07-03 DIAGNOSIS — R54 Age-related physical debility: Secondary | ICD-10-CM | POA: Insufficient documentation

## 2016-07-03 DIAGNOSIS — M1990 Unspecified osteoarthritis, unspecified site: Secondary | ICD-10-CM | POA: Insufficient documentation

## 2016-07-03 DIAGNOSIS — M419 Scoliosis, unspecified: Secondary | ICD-10-CM | POA: Insufficient documentation

## 2016-07-03 DIAGNOSIS — H9190 Unspecified hearing loss, unspecified ear: Secondary | ICD-10-CM | POA: Insufficient documentation

## 2016-07-03 DIAGNOSIS — I10 Essential (primary) hypertension: Secondary | ICD-10-CM | POA: Insufficient documentation

## 2016-07-03 DIAGNOSIS — E039 Hypothyroidism, unspecified: Secondary | ICD-10-CM | POA: Insufficient documentation

## 2016-07-03 DIAGNOSIS — Z87891 Personal history of nicotine dependence: Secondary | ICD-10-CM | POA: Insufficient documentation

## 2016-07-03 DIAGNOSIS — G8929 Other chronic pain: Secondary | ICD-10-CM | POA: Insufficient documentation

## 2016-07-03 DIAGNOSIS — K529 Noninfective gastroenteritis and colitis, unspecified: Secondary | ICD-10-CM

## 2016-07-03 DIAGNOSIS — E785 Hyperlipidemia, unspecified: Secondary | ICD-10-CM | POA: Insufficient documentation

## 2016-07-03 DIAGNOSIS — Z923 Personal history of irradiation: Secondary | ICD-10-CM | POA: Insufficient documentation

## 2016-07-03 DIAGNOSIS — Z9221 Personal history of antineoplastic chemotherapy: Secondary | ICD-10-CM | POA: Insufficient documentation

## 2016-07-03 NOTE — Progress Notes (Signed)
HEMATOLOGY/ONCOLOGY PROGRESS NOTE  Date of Service: 07/03/2016  Patient Care Team: Caryl Bis, MD as PCP - General (Family Medicine) Dr Anthony Sar MD (Surgery)  CHIEF COMPLAINTS/PURPOSE OF CONSULTATION:  Rectal cancer management  HISTORY OF PRESENTING ILLNESS:   Michael Baldwin is a wonderful 83 y.o. male who has been referred to [place] by Dr .Gar Ponto, MD And Dr Anthony Sar for evaluation and continued management of rectal cancer.  Patient has a history of hypertension, dyslipidemia, hypothyroidism, scoliosis with chronic back pain, osteoarthritis who presented with a lower GI bleed on 12/01/2013. He underwent a colonoscopy and was found to have a polyp in the rectum which was removed in pieces and as per outside records was noted to have frank invasive well-differentiated rectal adenocarcinoma. His CEA level initially was noted to be 1.2. He had an endoscopic ultrasound by Dr. Ardis Hughs on 12/22/2013 which confirmed malignant appearing mass in the posterior rectum which was nonobstructive and occupied 20% of the lumen with the distal edge about 8 cm from the anal verge. The mass was noted to be about 2.8 cm, heterogenous with invasion into but not through the muscularis propria and was staged as cT2,N0. CT of the abdomen and pelvis showed no evidence of metastases. He was treated with Xeloda chemotherapy with concurrent definitive RT. he had brief interruption of her Xeloda chemotherapy due to severe diarrhea and dehydration. He was deemed not to be a surgical candidate due to his delicate overall health.  Patient has been following with Dr Jacquiline Doe (last seen in April 2017) and Dr Anthony Sar (last seen in September 2017). He was initially seen at [facility] by Dr. Irene Limbo on 03/05/2016.  Today patient is accompanied by his wife . He is very hard of hearing she helps him answer most questions . Patient states he has been doing well. States he has gained 1 pound in last 2 weeks. Patient states chronic  diarrhea which is unchanged. Denies any rectal bleeding. His case was reviewed in GI tumor board.    MEDICAL HISTORY:  Past Medical History:  Diagnosis Date  . Cancer [facility])    rectal cancer  . Cataract, right 2005  . GERD (gastroesophageal reflux disease)   . H/O hiatal hernia 1996  . HOH (hard of hearing)   . Hypothyroidism   . Osteoarthritis 2000  . Rectal cancer ([place])   . Spinal disorder    scholeosis  Scoliosis with chronic radicular pain on Lyrica.  SURGICAL HISTORY: Past Surgical History:  Procedure Laterality Date  . CATARACT EXTRACTION W/ INTRAOCULAR LENS IMPLANT  2015  . EUS N/A 12/22/2013   Procedure: LOWER ENDOSCOPIC ULTRASOUND (EUS);  Surgeon: Milus Banister, MD;  Location: [facility];  Service: Endoscopy;  Laterality: N/A;  . HERNIA REPAIR Right 1996    SOCIAL HISTORY: Married lives with his wife. Previously chewed tobacco. He quit at age 66. 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 Tobacco smoking no alcohol no drugs  FAMILY HISTORY: History reviewed. No pertinent family history.  ALLERGIES:  is allergic to tape.  MEDICATIONS:  Current Outpatient Prescriptions  Medication Sig Dispense Refill  . bismuth subsalicylate (KAOPECTATE) 262 MG/15ML suspension Take 30 mLs by mouth every 6 (six) hours as needed for diarrhea or loose stools.    . cyclobenzaprine (FLEXERIL) 10 MG tablet Take 10 mg by mouth 3 (three) times daily as needed for muscle spasms.    . diphenhydrAMINE (BENADRYL) 50 MG tablet Take 50 mg by mouth  every 8 (eight) hours as needed for itching.     . hydrochlorothiazide (HYDRODIURIL) 25 MG tablet Take 25 mg by mouth daily.    Marland Kitchen levothyroxine (SYNTHROID, LEVOTHROID) 112 MCG tablet Take 112 mcg by mouth daily before breakfast.    . LORazepam (ATIVAN) 1 MG tablet Take (1) one tablet by mouth twice a day as needed for anxiety 60 tablet 1  . Multiple Vitamin (MULTIVITAMIN WITH MINERALS) TABS tablet Take 1 tablet  by mouth daily.    Vladimir Faster Glycol-Propyl Glycol (SYSTANE OP) Apply 1 drop to eye daily as needed (dry eyes).    . pregabalin (LYRICA) 50 MG capsule Take 50 mg by mouth 3 (three) times daily.    . ranitidine (ZANTAC) 150 MG capsule Take 150 mg by mouth daily as needed for heartburn.     . simethicone (MYLICON) 0000000 MG chewable tablet Chew 125 mg by mouth every 6 (six) hours as needed for flatulence.    . traMADol-acetaminophen (ULTRACET) 37.5-325 MG per tablet Take 1-2 tablets by mouth See admin instructions. Takes 1 tablet in the morning and 2 tablets in the evening     No current facility-administered medications for this visit.     REVIEW OF SYSTEMS:   Review of Systems  Constitutional: Negative.   HENT: Negative.   Eyes: Negative.   Respiratory: Negative.   Cardiovascular: Negative.   Gastrointestinal: Positive for diarrhea (chronic, unchanged).       Denies rectal bleeding  Genitourinary: Negative.   Musculoskeletal: Negative.   Skin: Negative.   Neurological: Negative.   Endo/Heme/Allergies: Negative.   Psychiatric/Behavioral: Negative.   All other systems reviewed and are negative. 14 Point review of Systems was done is negative except as noted above.   PHYSICAL EXAMINATION: ECOG PERFORMANCE STATUS: 2 - Symptomatic, <50% confined to bed  . Vitals:   07/03/16 1022  BP: 136/74  Pulse: 73  Resp: 18  Temp: 97.6 F (36.4 C)   Filed Weights   07/03/16 1022  Weight: 140 lb 3.2 oz (63.6 kg)   .Body mass index is 20.7 kg/m.   Physical Exam  Constitutional: He is oriented to person, place, and time.  Pt was able to get on exam table with assistance.  Wears glasses. Thin   HENT:  Head: Normocephalic and atraumatic.  Mouth/Throat: Oropharynx is clear and moist.  Eyes: Conjunctivae and EOM are normal. Pupils are equal, round, and reactive to light. No scleral icterus.  Neck: Normal range of motion. Neck supple.  Cardiovascular: Normal rate, regular rhythm and  normal heart sounds.   Pulmonary/Chest: Effort normal and breath sounds normal.  Abdominal: Soft. Bowel sounds are normal. He exhibits no distension and no mass. There is no tenderness. There is no rebound and no guarding.  Musculoskeletal: Normal range of motion.  Lymphadenopathy:    He has no cervical adenopathy.  Neurological: He is alert and oriented to person, place, and time. Gait normal.  Skin: Skin is warm and dry.  Psychiatric: Mood, memory, affect and judgment normal.  Nursing note and vitals reviewed.  LABORATORY DATA:  I have reviewed the data as listed  . CBC Latest Ref Rng & Units 06/09/2016 05/21/2016 03/05/2016  WBC 4.0 - 10.5 K/uL 6.2 5.5 5.2  Hemoglobin 13.0 - 17.0 g/dL 14.3 14.4 14.8  Hematocrit 39.0 - 52.0 % 43.7 44.0 45.0  Platelets 150 - 400 K/uL 189 172 158    . CMP Latest Ref Rng & Units 06/09/2016 05/21/2016 03/05/2016  Glucose 65 - 99 mg/dL  113(H) 92 100(H)  BUN 6 - 20 mg/dL 18 19 17   Creatinine 0.61 - 1.24 mg/dL 0.92 0.96 0.94  Sodium 135 - 145 mmol/L 137 137 137  Potassium 3.5 - 5.1 mmol/L 4.6 3.4(L) 4.3  Chloride 101 - 111 mmol/L 98(L) 99(L) 101  CO2 22 - 32 mmol/L 31 29 29   Calcium 8.9 - 10.3 mg/dL 9.5 9.4 9.5  Total Protein 6.5 - 8.1 g/dL 7.4 - 7.6  Total Bilirubin 0.3 - 1.2 mg/dL 0.7 - 1.1  Alkaline Phos 38 - 126 U/L 47 - 46  AST 15 - 41 U/L 24 - 21  ALT 17 - 63 U/L 17 - 17   PATHOLOGY:               RADIOGRAPHIC STUDIES: I have personally reviewed the radiological images as listed and agreed with the findings in the report.   ASSESSMENT & PLAN:    83 year old Caucasian male with multiple medical comorbidities with  1) History of rectal cancer diagnosed in 2015-  noted to be T2N0 on endoscopic ultrasound. Patient was deemed not to be a good surgical candidate due to his comorbidities and overall performance status.  he was treated with definitive Xeloda chemotherapy with radiation . CEA was WNL on initial diagnosis. Patient  had a CT of his abdomen and pelvis on 02/12/2016 by Dr. Burke Keels which showed sigmoid diverticulosis without inflammation Thickening of the urinary bladder or significantly from radiation  Recent anoscopy 05/28/16 with biopsy showed minimally invasive adenocarcinoma.    PLAN Patient's case was discussed at GI tumor board. Consensus was to follow clinically and anoscopy follow up with Dr. Marcello Moores. I will contact Dr. Marcello Moores to make sure he is set up for his next anoscopy. I have discussed the recommendations of tumor board with the patient and his wife. Continue surveillance at this time. RTC in 3 months for follow with repeated CBC, CMP, CEA.    All questions were answered with apparent satisfaction. The patient knows to call the clinic with any problems, questions or concerns.  This document serves as a record of services personally performed by Twana First, MD. It was created on her behalf by Shirlean Mylar, a trained medical scribe. The creation of this record is based on the scribe's personal observations and the provider's statements to them. This document has been checked and approved by the attending provider.   I have reviewed the above documentation for accuracy and completeness and I agree with the above.  07/03/2016 12:43 PM

## 2016-07-03 NOTE — Patient Instructions (Signed)
[facility] at [facility] Discharge Instructions  RECOMMENDATIONS MADE BY THE CONSULTANT AND ANY TEST RESULTS WILL BE SENT TO YOUR REFERRING PHYSICIAN.  You were seen today by Dr. Louis Zhou Follow up in 3 months with labwork See Amy up front for appointments   Thank you for choosing [facility] at [facility] to provide your oncology and hematology care.  To afford each patient quality time with our provider, please arrive at least 15 minutes before your scheduled appointment time.    If you have a lab appointment with the Cancer Center please come in thru the  Main Entrance and check in at the main information desk  You need to re-schedule your appointment should you arrive 10 or more minutes late.  We strive to give you quality time with our providers, and arriving late affects you and other patients whose appointments are after yours.  Also, if you no show three or more times for appointments you may be dismissed from the clinic at the providers discretion.     Again, thank you for choosing [facility].  Our hope is that these requests will decrease the amount of time that you wait before being seen by our physicians.       _____________________________________________________________  Should you have questions after your visit to [facility], please contact our office at [phone] between the hours of 8:30 a.m. and 4:30 p.m.  Voicemails left after 4:30 p.m. will not be returned until the following business day.  For prescription refill requests, have your pharmacy contact our office.       Resources For Cancer Patients and their Caregivers ? American [employer] Society: Can assist with transportation, wigs, general needs, runs Look Good Feel Better.        1-888-227-6333 ? Cancer Care: Provides financial assistance, online support groups, medication/co-pay assistance.  1-800-813-HOPE (4673) ? [facility] Assists [place] cancer patients and their families through emotional , educational and financial support.  336-427-4357 ? [place] DSS Where to apply for food stamps, Medicaid and utility assistance. 336-342-1394 ? [facility]: Transportation to medical appointments. 336-347-2287 ? Social Security Administration: May apply for disability if have a Stage IV cancer. 336-342-7796 1-800-772-1213 ? [place] [facility]ging, Disability and Transit Services: Assists with nutrition, care and transit needs. 336-349-2343  Cancer Center Support Programs: @[email or web address]@ > Cancer Support Group  2nd Tuesday of the month 1pm-2pm, Journey Room  > Creative Journey  3rd Tuesday of the month 1130am-1pm, Journey Room  > Look Good Feel Better  1st Wednesday of the month 10am-12 noon, Journey Room (Call American [employer] to register 1-800-395-5775)    

## 2016-07-18 DIAGNOSIS — I10 Essential (primary) hypertension: Secondary | ICD-10-CM | POA: Diagnosis not present

## 2016-07-18 DIAGNOSIS — E782 Mixed hyperlipidemia: Secondary | ICD-10-CM | POA: Diagnosis not present

## 2016-07-18 DIAGNOSIS — E039 Hypothyroidism, unspecified: Secondary | ICD-10-CM | POA: Diagnosis not present

## 2016-07-23 DIAGNOSIS — E039 Hypothyroidism, unspecified: Secondary | ICD-10-CM | POA: Diagnosis not present

## 2016-07-23 DIAGNOSIS — Q675 Congenital deformity of spine: Secondary | ICD-10-CM | POA: Diagnosis not present

## 2016-07-23 DIAGNOSIS — E782 Mixed hyperlipidemia: Secondary | ICD-10-CM | POA: Diagnosis not present

## 2016-07-23 DIAGNOSIS — M1A00X0 Idiopathic chronic gout, unspecified site, without tophus (tophi): Secondary | ICD-10-CM | POA: Diagnosis not present

## 2016-07-23 DIAGNOSIS — M545 Low back pain: Secondary | ICD-10-CM | POA: Diagnosis not present

## 2016-07-23 DIAGNOSIS — Z0001 Encounter for general adult medical examination with abnormal findings: Secondary | ICD-10-CM | POA: Diagnosis not present

## 2016-07-23 DIAGNOSIS — I10 Essential (primary) hypertension: Secondary | ICD-10-CM | POA: Diagnosis not present

## 2016-07-23 DIAGNOSIS — C20 Malignant neoplasm of rectum: Secondary | ICD-10-CM | POA: Diagnosis not present

## 2016-08-09 DIAGNOSIS — K029 Dental caries, unspecified: Secondary | ICD-10-CM | POA: Diagnosis not present

## 2016-08-09 DIAGNOSIS — Z6820 Body mass index (BMI) 20.0-20.9, adult: Secondary | ICD-10-CM | POA: Diagnosis not present

## 2016-09-18 DIAGNOSIS — T24292A Burn of second degree of multiple sites of left lower limb, except ankle and foot, initial encounter: Secondary | ICD-10-CM | POA: Diagnosis not present

## 2016-09-18 DIAGNOSIS — T310 Burns involving less than 10% of body surface: Secondary | ICD-10-CM | POA: Diagnosis not present

## 2016-09-18 DIAGNOSIS — T2122XA Burn of second degree of abdominal wall, initial encounter: Secondary | ICD-10-CM | POA: Diagnosis not present

## 2016-09-18 DIAGNOSIS — Z6820 Body mass index (BMI) 20.0-20.9, adult: Secondary | ICD-10-CM | POA: Diagnosis not present

## 2016-09-25 ENCOUNTER — Other Ambulatory Visit (HOSPITAL_COMMUNITY): Payer: Medicare Other

## 2016-10-01 ENCOUNTER — Ambulatory Visit (HOSPITAL_COMMUNITY): Payer: Medicare Other

## 2016-10-02 ENCOUNTER — Ambulatory Visit (HOSPITAL_COMMUNITY): Payer: Medicare Other

## 2016-11-21 DIAGNOSIS — E782 Mixed hyperlipidemia: Secondary | ICD-10-CM | POA: Diagnosis not present

## 2016-11-21 DIAGNOSIS — Z9189 Other specified personal risk factors, not elsewhere classified: Secondary | ICD-10-CM | POA: Diagnosis not present

## 2016-11-21 DIAGNOSIS — I10 Essential (primary) hypertension: Secondary | ICD-10-CM | POA: Diagnosis not present

## 2016-11-25 DIAGNOSIS — E039 Hypothyroidism, unspecified: Secondary | ICD-10-CM | POA: Diagnosis not present

## 2016-11-25 DIAGNOSIS — I10 Essential (primary) hypertension: Secondary | ICD-10-CM | POA: Diagnosis not present

## 2016-11-25 DIAGNOSIS — M1A00X0 Idiopathic chronic gout, unspecified site, without tophus (tophi): Secondary | ICD-10-CM | POA: Diagnosis not present

## 2016-11-25 DIAGNOSIS — E782 Mixed hyperlipidemia: Secondary | ICD-10-CM | POA: Diagnosis not present

## 2016-11-25 DIAGNOSIS — C20 Malignant neoplasm of rectum: Secondary | ICD-10-CM | POA: Diagnosis not present

## 2016-11-25 DIAGNOSIS — Z681 Body mass index (BMI) 19 or less, adult: Secondary | ICD-10-CM | POA: Diagnosis not present

## 2016-11-25 DIAGNOSIS — Q675 Congenital deformity of spine: Secondary | ICD-10-CM | POA: Diagnosis not present

## 2016-11-25 DIAGNOSIS — M545 Low back pain: Secondary | ICD-10-CM | POA: Diagnosis not present

## 2017-01-05 DIAGNOSIS — I10 Essential (primary) hypertension: Secondary | ICD-10-CM | POA: Diagnosis not present

## 2017-01-05 DIAGNOSIS — E039 Hypothyroidism, unspecified: Secondary | ICD-10-CM | POA: Diagnosis not present

## 2017-01-05 DIAGNOSIS — R5383 Other fatigue: Secondary | ICD-10-CM | POA: Diagnosis not present

## 2017-01-05 DIAGNOSIS — E782 Mixed hyperlipidemia: Secondary | ICD-10-CM | POA: Diagnosis not present

## 2017-02-05 DIAGNOSIS — R159 Full incontinence of feces: Secondary | ICD-10-CM | POA: Diagnosis not present

## 2017-02-05 DIAGNOSIS — C20 Malignant neoplasm of rectum: Secondary | ICD-10-CM | POA: Diagnosis not present

## 2017-02-05 DIAGNOSIS — Z6820 Body mass index (BMI) 20.0-20.9, adult: Secondary | ICD-10-CM | POA: Diagnosis not present

## 2017-02-05 DIAGNOSIS — M549 Dorsalgia, unspecified: Secondary | ICD-10-CM | POA: Diagnosis not present

## 2017-02-05 DIAGNOSIS — E039 Hypothyroidism, unspecified: Secondary | ICD-10-CM | POA: Diagnosis not present

## 2017-02-05 DIAGNOSIS — M419 Scoliosis, unspecified: Secondary | ICD-10-CM | POA: Diagnosis not present

## 2017-02-05 DIAGNOSIS — K922 Gastrointestinal hemorrhage, unspecified: Secondary | ICD-10-CM | POA: Diagnosis not present

## 2017-02-05 DIAGNOSIS — I10 Essential (primary) hypertension: Secondary | ICD-10-CM | POA: Diagnosis not present

## 2017-02-05 DIAGNOSIS — M1990 Unspecified osteoarthritis, unspecified site: Secondary | ICD-10-CM | POA: Diagnosis not present

## 2017-02-05 DIAGNOSIS — G8929 Other chronic pain: Secondary | ICD-10-CM | POA: Diagnosis not present

## 2017-02-05 DIAGNOSIS — Z923 Personal history of irradiation: Secondary | ICD-10-CM | POA: Diagnosis not present

## 2017-02-05 DIAGNOSIS — Z85038 Personal history of other malignant neoplasm of large intestine: Secondary | ICD-10-CM | POA: Diagnosis not present

## 2017-02-05 DIAGNOSIS — E785 Hyperlipidemia, unspecified: Secondary | ICD-10-CM | POA: Diagnosis not present

## 2017-02-05 DIAGNOSIS — Z809 Family history of malignant neoplasm, unspecified: Secondary | ICD-10-CM | POA: Diagnosis not present

## 2017-02-10 DIAGNOSIS — Z6820 Body mass index (BMI) 20.0-20.9, adult: Secondary | ICD-10-CM | POA: Diagnosis not present

## 2017-02-10 DIAGNOSIS — C20 Malignant neoplasm of rectum: Secondary | ICD-10-CM | POA: Diagnosis not present

## 2017-02-18 DIAGNOSIS — R159 Full incontinence of feces: Secondary | ICD-10-CM | POA: Diagnosis not present

## 2017-02-18 DIAGNOSIS — Z9889 Other specified postprocedural states: Secondary | ICD-10-CM | POA: Diagnosis not present

## 2017-02-18 DIAGNOSIS — Z91048 Other nonmedicinal substance allergy status: Secondary | ICD-10-CM | POA: Diagnosis not present

## 2017-02-18 DIAGNOSIS — E039 Hypothyroidism, unspecified: Secondary | ICD-10-CM | POA: Diagnosis not present

## 2017-02-18 DIAGNOSIS — K625 Hemorrhage of anus and rectum: Secondary | ICD-10-CM | POA: Diagnosis not present

## 2017-02-18 DIAGNOSIS — Z85048 Personal history of other malignant neoplasm of rectum, rectosigmoid junction, and anus: Secondary | ICD-10-CM | POA: Diagnosis not present

## 2017-02-18 DIAGNOSIS — M1990 Unspecified osteoarthritis, unspecified site: Secondary | ICD-10-CM | POA: Diagnosis not present

## 2017-02-18 DIAGNOSIS — C20 Malignant neoplasm of rectum: Secondary | ICD-10-CM | POA: Diagnosis not present

## 2017-02-18 DIAGNOSIS — K219 Gastro-esophageal reflux disease without esophagitis: Secondary | ICD-10-CM | POA: Diagnosis not present

## 2017-03-05 DIAGNOSIS — C20 Malignant neoplasm of rectum: Secondary | ICD-10-CM | POA: Diagnosis not present

## 2017-03-05 DIAGNOSIS — Z6820 Body mass index (BMI) 20.0-20.9, adult: Secondary | ICD-10-CM | POA: Diagnosis not present

## 2017-03-13 DIAGNOSIS — Z23 Encounter for immunization: Secondary | ICD-10-CM | POA: Diagnosis not present

## 2017-03-13 DIAGNOSIS — L820 Inflamed seborrheic keratosis: Secondary | ICD-10-CM | POA: Diagnosis not present

## 2017-03-24 DIAGNOSIS — E039 Hypothyroidism, unspecified: Secondary | ICD-10-CM | POA: Diagnosis not present

## 2017-03-24 DIAGNOSIS — I10 Essential (primary) hypertension: Secondary | ICD-10-CM | POA: Diagnosis not present

## 2017-03-24 DIAGNOSIS — Z9189 Other specified personal risk factors, not elsewhere classified: Secondary | ICD-10-CM | POA: Diagnosis not present

## 2017-03-24 DIAGNOSIS — M1990 Unspecified osteoarthritis, unspecified site: Secondary | ICD-10-CM | POA: Diagnosis not present

## 2017-03-24 DIAGNOSIS — E782 Mixed hyperlipidemia: Secondary | ICD-10-CM | POA: Diagnosis not present

## 2017-03-24 DIAGNOSIS — C20 Malignant neoplasm of rectum: Secondary | ICD-10-CM | POA: Diagnosis not present

## 2017-03-26 DIAGNOSIS — E039 Hypothyroidism, unspecified: Secondary | ICD-10-CM | POA: Diagnosis not present

## 2017-03-26 DIAGNOSIS — Z681 Body mass index (BMI) 19 or less, adult: Secondary | ICD-10-CM | POA: Diagnosis not present

## 2017-03-26 DIAGNOSIS — C20 Malignant neoplasm of rectum: Secondary | ICD-10-CM | POA: Diagnosis not present

## 2017-03-26 DIAGNOSIS — M1A00X0 Idiopathic chronic gout, unspecified site, without tophus (tophi): Secondary | ICD-10-CM | POA: Diagnosis not present

## 2017-03-26 DIAGNOSIS — E782 Mixed hyperlipidemia: Secondary | ICD-10-CM | POA: Diagnosis not present

## 2017-03-26 DIAGNOSIS — I10 Essential (primary) hypertension: Secondary | ICD-10-CM | POA: Diagnosis not present

## 2017-03-26 DIAGNOSIS — M545 Low back pain: Secondary | ICD-10-CM | POA: Diagnosis not present

## 2017-03-26 DIAGNOSIS — Q675 Congenital deformity of spine: Secondary | ICD-10-CM | POA: Diagnosis not present

## 2017-04-20 DIAGNOSIS — K6389 Other specified diseases of intestine: Secondary | ICD-10-CM | POA: Diagnosis not present

## 2017-04-20 DIAGNOSIS — K219 Gastro-esophageal reflux disease without esophagitis: Secondary | ICD-10-CM | POA: Diagnosis not present

## 2017-04-20 DIAGNOSIS — K625 Hemorrhage of anus and rectum: Secondary | ICD-10-CM | POA: Diagnosis not present

## 2017-04-20 DIAGNOSIS — Z79899 Other long term (current) drug therapy: Secondary | ICD-10-CM | POA: Diagnosis not present

## 2017-04-20 DIAGNOSIS — R1030 Lower abdominal pain, unspecified: Secondary | ICD-10-CM | POA: Diagnosis not present

## 2017-04-20 DIAGNOSIS — A045 Campylobacter enteritis: Secondary | ICD-10-CM | POA: Diagnosis not present

## 2017-04-20 DIAGNOSIS — E039 Hypothyroidism, unspecified: Secondary | ICD-10-CM | POA: Diagnosis not present

## 2017-04-20 DIAGNOSIS — M1990 Unspecified osteoarthritis, unspecified site: Secondary | ICD-10-CM | POA: Diagnosis not present

## 2017-04-20 DIAGNOSIS — C20 Malignant neoplasm of rectum: Secondary | ICD-10-CM | POA: Diagnosis not present

## 2017-04-23 DIAGNOSIS — K51511 Left sided colitis with rectal bleeding: Secondary | ICD-10-CM | POA: Diagnosis not present

## 2017-04-23 DIAGNOSIS — Z6820 Body mass index (BMI) 20.0-20.9, adult: Secondary | ICD-10-CM | POA: Diagnosis not present

## 2017-04-24 DIAGNOSIS — K51811 Other ulcerative colitis with rectal bleeding: Secondary | ICD-10-CM | POA: Diagnosis not present

## 2017-05-06 DIAGNOSIS — C20 Malignant neoplasm of rectum: Secondary | ICD-10-CM | POA: Diagnosis not present

## 2017-05-08 DIAGNOSIS — C20 Malignant neoplasm of rectum: Secondary | ICD-10-CM | POA: Diagnosis not present

## 2017-05-08 DIAGNOSIS — R970 Elevated carcinoembryonic antigen [CEA]: Secondary | ICD-10-CM | POA: Diagnosis not present

## 2017-05-21 ENCOUNTER — Other Ambulatory Visit (HOSPITAL_COMMUNITY): Payer: Self-pay | Admitting: Oncology

## 2017-05-21 DIAGNOSIS — C20 Malignant neoplasm of rectum: Secondary | ICD-10-CM

## 2017-06-03 ENCOUNTER — Encounter (HOSPITAL_COMMUNITY)
Admission: RE | Admit: 2017-06-03 | Discharge: 2017-06-03 | Disposition: A | Discharge status: Home / Self Care | Payer: Medicare Other | Source: Ambulatory Visit | Attending: Oncology | Admitting: Oncology

## 2017-06-03 DIAGNOSIS — C20 Malignant neoplasm of rectum: Secondary | ICD-10-CM | POA: Diagnosis not present

## 2017-06-03 DIAGNOSIS — C218 Malignant neoplasm of overlapping sites of rectum, anus and anal canal: Secondary | ICD-10-CM | POA: Diagnosis not present

## 2017-06-03 LAB — GLUCOSE, CAPILLARY: Glucose-Capillary: 107 mg/dL — ABNORMAL HIGH (ref 65–99)

## 2017-06-03 MED ORDER — FLUDEOXYGLUCOSE F - 18 (FDG) INJECTION
7.6000 | Freq: Once | INTRAVENOUS | Status: AC | PRN
  Administered 2017-06-03: 7.6 via INTRAVENOUS

## 2017-06-11 DIAGNOSIS — E039 Hypothyroidism, unspecified: Secondary | ICD-10-CM | POA: Diagnosis not present

## 2017-06-11 DIAGNOSIS — I10 Essential (primary) hypertension: Secondary | ICD-10-CM | POA: Diagnosis not present

## 2017-06-11 DIAGNOSIS — C20 Malignant neoplasm of rectum: Secondary | ICD-10-CM | POA: Diagnosis not present

## 2017-06-11 DIAGNOSIS — Z79899 Other long term (current) drug therapy: Secondary | ICD-10-CM | POA: Diagnosis not present

## 2017-06-11 DIAGNOSIS — E785 Hyperlipidemia, unspecified: Secondary | ICD-10-CM | POA: Diagnosis not present

## 2017-06-19 DIAGNOSIS — K219 Gastro-esophageal reflux disease without esophagitis: Secondary | ICD-10-CM | POA: Diagnosis not present

## 2017-06-19 DIAGNOSIS — R0602 Shortness of breath: Secondary | ICD-10-CM | POA: Diagnosis not present

## 2017-06-19 DIAGNOSIS — G609 Hereditary and idiopathic neuropathy, unspecified: Secondary | ICD-10-CM | POA: Diagnosis not present

## 2017-06-19 DIAGNOSIS — E039 Hypothyroidism, unspecified: Secondary | ICD-10-CM | POA: Diagnosis not present

## 2017-06-19 DIAGNOSIS — Z79899 Other long term (current) drug therapy: Secondary | ICD-10-CM | POA: Diagnosis not present

## 2017-06-19 DIAGNOSIS — M1990 Unspecified osteoarthritis, unspecified site: Secondary | ICD-10-CM | POA: Diagnosis not present

## 2017-06-19 DIAGNOSIS — Z85048 Personal history of other malignant neoplasm of rectum, rectosigmoid junction, and anus: Secondary | ICD-10-CM | POA: Diagnosis not present

## 2017-06-19 DIAGNOSIS — M4185 Other forms of scoliosis, thoracolumbar region: Secondary | ICD-10-CM | POA: Diagnosis not present

## 2017-06-19 DIAGNOSIS — R0789 Other chest pain: Secondary | ICD-10-CM | POA: Diagnosis not present

## 2017-06-19 DIAGNOSIS — I10 Essential (primary) hypertension: Secondary | ICD-10-CM | POA: Diagnosis not present

## 2017-06-19 DIAGNOSIS — R079 Chest pain, unspecified: Secondary | ICD-10-CM | POA: Diagnosis not present

## 2017-06-20 DIAGNOSIS — R079 Chest pain, unspecified: Secondary | ICD-10-CM | POA: Diagnosis not present

## 2017-06-25 DIAGNOSIS — R911 Solitary pulmonary nodule: Secondary | ICD-10-CM | POA: Diagnosis not present

## 2017-06-25 DIAGNOSIS — I700 Atherosclerosis of aorta: Secondary | ICD-10-CM | POA: Diagnosis not present

## 2017-06-25 DIAGNOSIS — M419 Scoliosis, unspecified: Secondary | ICD-10-CM | POA: Diagnosis not present

## 2017-06-25 DIAGNOSIS — K5730 Diverticulosis of large intestine without perforation or abscess without bleeding: Secondary | ICD-10-CM | POA: Diagnosis not present

## 2017-06-25 DIAGNOSIS — C20 Malignant neoplasm of rectum: Secondary | ICD-10-CM | POA: Diagnosis not present

## 2017-06-25 DIAGNOSIS — K769 Liver disease, unspecified: Secondary | ICD-10-CM | POA: Diagnosis not present

## 2017-06-26 DIAGNOSIS — M19012 Primary osteoarthritis, left shoulder: Secondary | ICD-10-CM | POA: Diagnosis not present

## 2017-07-09 DIAGNOSIS — M545 Low back pain: Secondary | ICD-10-CM | POA: Diagnosis not present

## 2017-07-09 DIAGNOSIS — M19012 Primary osteoarthritis, left shoulder: Secondary | ICD-10-CM | POA: Diagnosis not present

## 2017-07-09 DIAGNOSIS — M19011 Primary osteoarthritis, right shoulder: Secondary | ICD-10-CM | POA: Diagnosis not present

## 2017-07-09 DIAGNOSIS — Z681 Body mass index (BMI) 19 or less, adult: Secondary | ICD-10-CM | POA: Diagnosis not present

## 2017-07-20 DIAGNOSIS — R072 Precordial pain: Secondary | ICD-10-CM | POA: Diagnosis not present

## 2017-07-20 DIAGNOSIS — Z6820 Body mass index (BMI) 20.0-20.9, adult: Secondary | ICD-10-CM | POA: Diagnosis not present

## 2017-08-20 DIAGNOSIS — I10 Essential (primary) hypertension: Secondary | ICD-10-CM | POA: Diagnosis not present

## 2017-08-20 DIAGNOSIS — Z9189 Other specified personal risk factors, not elsewhere classified: Secondary | ICD-10-CM | POA: Diagnosis not present

## 2017-08-20 DIAGNOSIS — E782 Mixed hyperlipidemia: Secondary | ICD-10-CM | POA: Diagnosis not present

## 2017-08-20 DIAGNOSIS — Z0001 Encounter for general adult medical examination with abnormal findings: Secondary | ICD-10-CM | POA: Diagnosis not present

## 2017-08-20 DIAGNOSIS — R5383 Other fatigue: Secondary | ICD-10-CM | POA: Diagnosis not present

## 2017-08-20 DIAGNOSIS — E039 Hypothyroidism, unspecified: Secondary | ICD-10-CM | POA: Diagnosis not present

## 2017-08-20 DIAGNOSIS — Z681 Body mass index (BMI) 19 or less, adult: Secondary | ICD-10-CM | POA: Diagnosis not present

## 2017-08-25 DIAGNOSIS — E782 Mixed hyperlipidemia: Secondary | ICD-10-CM | POA: Diagnosis not present

## 2017-08-25 DIAGNOSIS — C20 Malignant neoplasm of rectum: Secondary | ICD-10-CM | POA: Diagnosis not present

## 2017-08-25 DIAGNOSIS — E039 Hypothyroidism, unspecified: Secondary | ICD-10-CM | POA: Diagnosis not present

## 2017-08-25 DIAGNOSIS — M1A00X0 Idiopathic chronic gout, unspecified site, without tophus (tophi): Secondary | ICD-10-CM | POA: Diagnosis not present

## 2017-08-25 DIAGNOSIS — L820 Inflamed seborrheic keratosis: Secondary | ICD-10-CM | POA: Diagnosis not present

## 2017-08-25 DIAGNOSIS — Z0001 Encounter for general adult medical examination with abnormal findings: Secondary | ICD-10-CM | POA: Diagnosis not present

## 2017-08-25 DIAGNOSIS — I10 Essential (primary) hypertension: Secondary | ICD-10-CM | POA: Diagnosis not present

## 2017-08-25 DIAGNOSIS — Z681 Body mass index (BMI) 19 or less, adult: Secondary | ICD-10-CM | POA: Diagnosis not present

## 2017-09-09 DIAGNOSIS — Z681 Body mass index (BMI) 19 or less, adult: Secondary | ICD-10-CM | POA: Diagnosis not present

## 2017-09-09 DIAGNOSIS — E039 Hypothyroidism, unspecified: Secondary | ICD-10-CM | POA: Diagnosis not present

## 2017-09-09 DIAGNOSIS — M19041 Primary osteoarthritis, right hand: Secondary | ICD-10-CM | POA: Diagnosis not present

## 2017-09-09 DIAGNOSIS — R110 Nausea: Secondary | ICD-10-CM | POA: Diagnosis not present

## 2017-09-09 DIAGNOSIS — Z79899 Other long term (current) drug therapy: Secondary | ICD-10-CM | POA: Diagnosis not present

## 2017-09-09 DIAGNOSIS — M419 Scoliosis, unspecified: Secondary | ICD-10-CM | POA: Diagnosis not present

## 2017-09-09 DIAGNOSIS — Z7952 Long term (current) use of systemic steroids: Secondary | ICD-10-CM | POA: Diagnosis not present

## 2017-09-09 DIAGNOSIS — C20 Malignant neoplasm of rectum: Secondary | ICD-10-CM | POA: Diagnosis not present

## 2017-09-09 DIAGNOSIS — M19012 Primary osteoarthritis, left shoulder: Secondary | ICD-10-CM | POA: Diagnosis not present

## 2017-09-09 DIAGNOSIS — K921 Melena: Secondary | ICD-10-CM | POA: Diagnosis not present

## 2017-09-09 DIAGNOSIS — I10 Essential (primary) hypertension: Secondary | ICD-10-CM | POA: Diagnosis not present

## 2017-09-09 DIAGNOSIS — R152 Fecal urgency: Secondary | ICD-10-CM | POA: Diagnosis not present

## 2017-09-09 DIAGNOSIS — M19011 Primary osteoarthritis, right shoulder: Secondary | ICD-10-CM | POA: Diagnosis not present

## 2017-09-09 DIAGNOSIS — S60222A Contusion of left hand, initial encounter: Secondary | ICD-10-CM | POA: Diagnosis not present

## 2017-09-09 DIAGNOSIS — Z9221 Personal history of antineoplastic chemotherapy: Secondary | ICD-10-CM | POA: Diagnosis not present

## 2017-09-09 DIAGNOSIS — R634 Abnormal weight loss: Secondary | ICD-10-CM | POA: Diagnosis not present

## 2017-09-09 DIAGNOSIS — Z923 Personal history of irradiation: Secondary | ICD-10-CM | POA: Diagnosis not present

## 2017-09-10 DIAGNOSIS — M79641 Pain in right hand: Secondary | ICD-10-CM | POA: Diagnosis not present

## 2017-09-10 DIAGNOSIS — Z681 Body mass index (BMI) 19 or less, adult: Secondary | ICD-10-CM | POA: Diagnosis not present

## 2017-09-15 ENCOUNTER — Other Ambulatory Visit (HOSPITAL_COMMUNITY): Payer: Self-pay | Admitting: Oncology

## 2017-09-15 DIAGNOSIS — C20 Malignant neoplasm of rectum: Secondary | ICD-10-CM

## 2017-09-21 ENCOUNTER — Encounter (HOSPITAL_COMMUNITY)
Admission: RE | Admit: 2017-09-21 | Discharge: 2017-09-21 | Disposition: A | Discharge status: Home / Self Care | Payer: Medicare Other | Source: Ambulatory Visit | Attending: Oncology | Admitting: Oncology

## 2017-09-21 DIAGNOSIS — C20 Malignant neoplasm of rectum: Secondary | ICD-10-CM | POA: Insufficient documentation

## 2017-09-21 LAB — GLUCOSE, CAPILLARY: Glucose-Capillary: 96 mg/dL (ref 65–99)

## 2017-09-21 MED ORDER — FLUDEOXYGLUCOSE F - 18 (FDG) INJECTION
8.1400 | Freq: Once | INTRAVENOUS | Status: AC | PRN
  Administered 2017-09-21: 8.14 via INTRAVENOUS

## 2017-10-07 DIAGNOSIS — C20 Malignant neoplasm of rectum: Secondary | ICD-10-CM | POA: Diagnosis not present

## 2017-10-09 DIAGNOSIS — Z9221 Personal history of antineoplastic chemotherapy: Secondary | ICD-10-CM | POA: Diagnosis not present

## 2017-10-09 DIAGNOSIS — C20 Malignant neoplasm of rectum: Secondary | ICD-10-CM | POA: Diagnosis not present

## 2017-10-09 DIAGNOSIS — Z923 Personal history of irradiation: Secondary | ICD-10-CM | POA: Diagnosis not present

## 2017-12-09 DIAGNOSIS — E039 Hypothyroidism, unspecified: Secondary | ICD-10-CM | POA: Diagnosis not present

## 2017-12-09 DIAGNOSIS — K219 Gastro-esophageal reflux disease without esophagitis: Secondary | ICD-10-CM | POA: Diagnosis not present

## 2017-12-09 DIAGNOSIS — M1990 Unspecified osteoarthritis, unspecified site: Secondary | ICD-10-CM | POA: Diagnosis not present

## 2017-12-09 DIAGNOSIS — Z85048 Personal history of other malignant neoplasm of rectum, rectosigmoid junction, and anus: Secondary | ICD-10-CM | POA: Diagnosis not present

## 2017-12-09 DIAGNOSIS — Z79899 Other long term (current) drug therapy: Secondary | ICD-10-CM | POA: Diagnosis not present

## 2017-12-09 DIAGNOSIS — L509 Urticaria, unspecified: Secondary | ICD-10-CM | POA: Diagnosis not present

## 2017-12-31 DIAGNOSIS — L040 Acute lymphadenitis of face, head and neck: Secondary | ICD-10-CM | POA: Diagnosis not present

## 2017-12-31 DIAGNOSIS — K029 Dental caries, unspecified: Secondary | ICD-10-CM | POA: Diagnosis not present

## 2017-12-31 DIAGNOSIS — Z6820 Body mass index (BMI) 20.0-20.9, adult: Secondary | ICD-10-CM | POA: Diagnosis not present

## 2018-01-06 DIAGNOSIS — Z923 Personal history of irradiation: Secondary | ICD-10-CM | POA: Diagnosis not present

## 2018-01-06 DIAGNOSIS — C20 Malignant neoplasm of rectum: Secondary | ICD-10-CM | POA: Diagnosis not present

## 2018-01-06 DIAGNOSIS — Z9221 Personal history of antineoplastic chemotherapy: Secondary | ICD-10-CM | POA: Diagnosis not present

## 2018-01-08 DIAGNOSIS — Z6820 Body mass index (BMI) 20.0-20.9, adult: Secondary | ICD-10-CM | POA: Diagnosis not present

## 2018-01-08 DIAGNOSIS — C20 Malignant neoplasm of rectum: Secondary | ICD-10-CM | POA: Diagnosis not present

## 2018-01-14 DIAGNOSIS — M542 Cervicalgia: Secondary | ICD-10-CM | POA: Diagnosis not present

## 2018-01-14 DIAGNOSIS — Z681 Body mass index (BMI) 19 or less, adult: Secondary | ICD-10-CM | POA: Diagnosis not present

## 2018-01-14 DIAGNOSIS — L500 Allergic urticaria: Secondary | ICD-10-CM | POA: Diagnosis not present

## 2018-01-22 DIAGNOSIS — C20 Malignant neoplasm of rectum: Secondary | ICD-10-CM | POA: Diagnosis not present

## 2018-01-29 DIAGNOSIS — C20 Malignant neoplasm of rectum: Secondary | ICD-10-CM | POA: Diagnosis not present

## 2018-01-29 DIAGNOSIS — E785 Hyperlipidemia, unspecified: Secondary | ICD-10-CM | POA: Diagnosis not present

## 2018-01-29 DIAGNOSIS — E039 Hypothyroidism, unspecified: Secondary | ICD-10-CM | POA: Diagnosis not present

## 2018-01-29 DIAGNOSIS — Z79899 Other long term (current) drug therapy: Secondary | ICD-10-CM | POA: Diagnosis not present

## 2018-01-29 DIAGNOSIS — K219 Gastro-esophageal reflux disease without esophagitis: Secondary | ICD-10-CM | POA: Diagnosis not present

## 2018-01-29 DIAGNOSIS — M419 Scoliosis, unspecified: Secondary | ICD-10-CM | POA: Diagnosis not present

## 2018-01-29 DIAGNOSIS — Z5111 Encounter for antineoplastic chemotherapy: Secondary | ICD-10-CM | POA: Diagnosis not present

## 2018-01-29 DIAGNOSIS — R634 Abnormal weight loss: Secondary | ICD-10-CM | POA: Diagnosis not present

## 2018-01-29 DIAGNOSIS — R197 Diarrhea, unspecified: Secondary | ICD-10-CM | POA: Diagnosis not present

## 2018-01-29 DIAGNOSIS — I10 Essential (primary) hypertension: Secondary | ICD-10-CM | POA: Diagnosis not present

## 2018-02-04 DIAGNOSIS — M419 Scoliosis, unspecified: Secondary | ICD-10-CM | POA: Diagnosis not present

## 2018-02-04 DIAGNOSIS — E785 Hyperlipidemia, unspecified: Secondary | ICD-10-CM | POA: Diagnosis not present

## 2018-02-04 DIAGNOSIS — C20 Malignant neoplasm of rectum: Secondary | ICD-10-CM | POA: Diagnosis not present

## 2018-02-04 DIAGNOSIS — E039 Hypothyroidism, unspecified: Secondary | ICD-10-CM | POA: Diagnosis not present

## 2018-02-04 DIAGNOSIS — I10 Essential (primary) hypertension: Secondary | ICD-10-CM | POA: Diagnosis not present

## 2018-02-04 DIAGNOSIS — K219 Gastro-esophageal reflux disease without esophagitis: Secondary | ICD-10-CM | POA: Diagnosis not present

## 2018-02-04 DIAGNOSIS — Z6820 Body mass index (BMI) 20.0-20.9, adult: Secondary | ICD-10-CM | POA: Diagnosis not present

## 2018-02-04 DIAGNOSIS — Z79899 Other long term (current) drug therapy: Secondary | ICD-10-CM | POA: Diagnosis not present

## 2018-02-08 DIAGNOSIS — Z23 Encounter for immunization: Secondary | ICD-10-CM | POA: Diagnosis not present

## 2018-02-08 DIAGNOSIS — Z681 Body mass index (BMI) 19 or less, adult: Secondary | ICD-10-CM | POA: Diagnosis not present

## 2018-02-08 DIAGNOSIS — L03116 Cellulitis of left lower limb: Secondary | ICD-10-CM | POA: Diagnosis not present

## 2018-02-18 DIAGNOSIS — Z5111 Encounter for antineoplastic chemotherapy: Secondary | ICD-10-CM | POA: Diagnosis not present

## 2018-02-18 DIAGNOSIS — R197 Diarrhea, unspecified: Secondary | ICD-10-CM | POA: Diagnosis not present

## 2018-02-18 DIAGNOSIS — Z6820 Body mass index (BMI) 20.0-20.9, adult: Secondary | ICD-10-CM | POA: Diagnosis not present

## 2018-02-18 DIAGNOSIS — E785 Hyperlipidemia, unspecified: Secondary | ICD-10-CM | POA: Diagnosis not present

## 2018-02-18 DIAGNOSIS — E039 Hypothyroidism, unspecified: Secondary | ICD-10-CM | POA: Diagnosis not present

## 2018-02-18 DIAGNOSIS — I10 Essential (primary) hypertension: Secondary | ICD-10-CM | POA: Diagnosis not present

## 2018-02-18 DIAGNOSIS — C20 Malignant neoplasm of rectum: Secondary | ICD-10-CM | POA: Diagnosis not present

## 2018-02-18 DIAGNOSIS — Z79899 Other long term (current) drug therapy: Secondary | ICD-10-CM | POA: Diagnosis not present

## 2018-02-19 DIAGNOSIS — E782 Mixed hyperlipidemia: Secondary | ICD-10-CM | POA: Diagnosis not present

## 2018-02-19 DIAGNOSIS — Z9189 Other specified personal risk factors, not elsewhere classified: Secondary | ICD-10-CM | POA: Diagnosis not present

## 2018-02-19 DIAGNOSIS — M1990 Unspecified osteoarthritis, unspecified site: Secondary | ICD-10-CM | POA: Diagnosis not present

## 2018-02-19 DIAGNOSIS — E039 Hypothyroidism, unspecified: Secondary | ICD-10-CM | POA: Diagnosis not present

## 2018-02-19 DIAGNOSIS — R5383 Other fatigue: Secondary | ICD-10-CM | POA: Diagnosis not present

## 2018-02-19 DIAGNOSIS — I10 Essential (primary) hypertension: Secondary | ICD-10-CM | POA: Diagnosis not present

## 2018-02-23 DIAGNOSIS — E782 Mixed hyperlipidemia: Secondary | ICD-10-CM | POA: Diagnosis not present

## 2018-02-23 DIAGNOSIS — Q675 Congenital deformity of spine: Secondary | ICD-10-CM | POA: Diagnosis not present

## 2018-02-23 DIAGNOSIS — L820 Inflamed seborrheic keratosis: Secondary | ICD-10-CM | POA: Diagnosis not present

## 2018-02-23 DIAGNOSIS — M1A00X0 Idiopathic chronic gout, unspecified site, without tophus (tophi): Secondary | ICD-10-CM | POA: Diagnosis not present

## 2018-02-23 DIAGNOSIS — Z6820 Body mass index (BMI) 20.0-20.9, adult: Secondary | ICD-10-CM | POA: Diagnosis not present

## 2018-02-23 DIAGNOSIS — C20 Malignant neoplasm of rectum: Secondary | ICD-10-CM | POA: Diagnosis not present

## 2018-02-23 DIAGNOSIS — E039 Hypothyroidism, unspecified: Secondary | ICD-10-CM | POA: Diagnosis not present

## 2018-02-23 DIAGNOSIS — I10 Essential (primary) hypertension: Secondary | ICD-10-CM | POA: Diagnosis not present

## 2018-03-11 DIAGNOSIS — Z6821 Body mass index (BMI) 21.0-21.9, adult: Secondary | ICD-10-CM | POA: Diagnosis not present

## 2018-03-11 DIAGNOSIS — Z5111 Encounter for antineoplastic chemotherapy: Secondary | ICD-10-CM | POA: Diagnosis not present

## 2018-03-11 DIAGNOSIS — L600 Ingrowing nail: Secondary | ICD-10-CM | POA: Diagnosis not present

## 2018-03-11 DIAGNOSIS — M549 Dorsalgia, unspecified: Secondary | ICD-10-CM | POA: Diagnosis not present

## 2018-03-11 DIAGNOSIS — E785 Hyperlipidemia, unspecified: Secondary | ICD-10-CM | POA: Diagnosis not present

## 2018-03-11 DIAGNOSIS — Z9049 Acquired absence of other specified parts of digestive tract: Secondary | ICD-10-CM | POA: Diagnosis not present

## 2018-03-11 DIAGNOSIS — E039 Hypothyroidism, unspecified: Secondary | ICD-10-CM | POA: Diagnosis not present

## 2018-03-11 DIAGNOSIS — I10 Essential (primary) hypertension: Secondary | ICD-10-CM | POA: Diagnosis not present

## 2018-03-11 DIAGNOSIS — C20 Malignant neoplasm of rectum: Secondary | ICD-10-CM | POA: Diagnosis not present

## 2018-03-12 DIAGNOSIS — L600 Ingrowing nail: Secondary | ICD-10-CM | POA: Diagnosis not present

## 2018-04-01 DIAGNOSIS — C20 Malignant neoplasm of rectum: Secondary | ICD-10-CM | POA: Diagnosis not present

## 2018-04-01 DIAGNOSIS — R634 Abnormal weight loss: Secondary | ICD-10-CM | POA: Diagnosis not present

## 2018-04-01 DIAGNOSIS — Z6820 Body mass index (BMI) 20.0-20.9, adult: Secondary | ICD-10-CM | POA: Diagnosis not present

## 2018-04-01 DIAGNOSIS — R197 Diarrhea, unspecified: Secondary | ICD-10-CM | POA: Diagnosis not present

## 2018-04-12 DIAGNOSIS — C218 Malignant neoplasm of overlapping sites of rectum, anus and anal canal: Secondary | ICD-10-CM | POA: Diagnosis not present

## 2018-04-12 DIAGNOSIS — C20 Malignant neoplasm of rectum: Secondary | ICD-10-CM | POA: Diagnosis not present

## 2018-04-22 DIAGNOSIS — Z5111 Encounter for antineoplastic chemotherapy: Secondary | ICD-10-CM | POA: Diagnosis not present

## 2018-04-22 DIAGNOSIS — C20 Malignant neoplasm of rectum: Secondary | ICD-10-CM | POA: Diagnosis not present

## 2018-04-22 DIAGNOSIS — Z6821 Body mass index (BMI) 21.0-21.9, adult: Secondary | ICD-10-CM | POA: Diagnosis not present

## 2018-05-06 DIAGNOSIS — Z6821 Body mass index (BMI) 21.0-21.9, adult: Secondary | ICD-10-CM | POA: Diagnosis not present

## 2018-05-06 DIAGNOSIS — R609 Edema, unspecified: Secondary | ICD-10-CM | POA: Diagnosis not present

## 2018-05-06 DIAGNOSIS — I10 Essential (primary) hypertension: Secondary | ICD-10-CM | POA: Diagnosis not present

## 2018-05-13 DIAGNOSIS — L820 Inflamed seborrheic keratosis: Secondary | ICD-10-CM | POA: Diagnosis not present

## 2018-05-13 DIAGNOSIS — I10 Essential (primary) hypertension: Secondary | ICD-10-CM | POA: Diagnosis not present

## 2018-05-24 DIAGNOSIS — Z6821 Body mass index (BMI) 21.0-21.9, adult: Secondary | ICD-10-CM | POA: Diagnosis not present

## 2018-05-24 DIAGNOSIS — C20 Malignant neoplasm of rectum: Secondary | ICD-10-CM | POA: Diagnosis not present

## 2018-05-24 DIAGNOSIS — Z9221 Personal history of antineoplastic chemotherapy: Secondary | ICD-10-CM | POA: Diagnosis not present

## 2018-07-05 DIAGNOSIS — E039 Hypothyroidism, unspecified: Secondary | ICD-10-CM | POA: Diagnosis not present

## 2018-07-05 DIAGNOSIS — I10 Essential (primary) hypertension: Secondary | ICD-10-CM | POA: Diagnosis not present

## 2018-07-05 DIAGNOSIS — E782 Mixed hyperlipidemia: Secondary | ICD-10-CM | POA: Diagnosis not present

## 2018-07-08 DIAGNOSIS — E782 Mixed hyperlipidemia: Secondary | ICD-10-CM | POA: Diagnosis not present

## 2018-07-08 DIAGNOSIS — E039 Hypothyroidism, unspecified: Secondary | ICD-10-CM | POA: Diagnosis not present

## 2018-07-08 DIAGNOSIS — C20 Malignant neoplasm of rectum: Secondary | ICD-10-CM | POA: Diagnosis not present

## 2018-07-08 DIAGNOSIS — M1A00X0 Idiopathic chronic gout, unspecified site, without tophus (tophi): Secondary | ICD-10-CM | POA: Diagnosis not present

## 2018-07-08 DIAGNOSIS — Z6820 Body mass index (BMI) 20.0-20.9, adult: Secondary | ICD-10-CM | POA: Diagnosis not present

## 2018-07-08 DIAGNOSIS — I10 Essential (primary) hypertension: Secondary | ICD-10-CM | POA: Diagnosis not present

## 2018-07-23 DIAGNOSIS — Z9221 Personal history of antineoplastic chemotherapy: Secondary | ICD-10-CM | POA: Diagnosis not present

## 2018-07-23 DIAGNOSIS — C20 Malignant neoplasm of rectum: Secondary | ICD-10-CM | POA: Diagnosis not present

## 2018-07-24 IMAGING — PT NM PET TUM IMG RESTAG (PS) SKULL BASE T - THIGH
8 series · 21 of 25 positions shown · non-contrast
Comparison: 06/03/2017

CLINICAL DATA: Subsequent treatment strategy for rectal cancer.

EXAM:
NUCLEAR MEDICINE PET SKULL BASE TO THIGH
TECHNIQUE: 8.14 mCi F-18 FDG was injected intravenously. Full-ring PET imaging
was performed from the skull base to thigh after the radiotracer. CT
data was obtained and used for attenuation correction and anatomic
localization.
Fasting blood glucose: 96 mg/dl

[Series 3: pet sk_thigh ac · axial · 5.0mm · 4.07mm/px · z∈[-881,-305]mm · 3 of 218 slices shown]
[im 1/218]
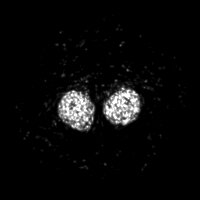
[im 73/218]
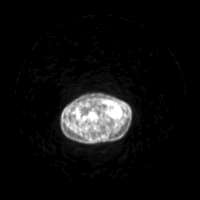
[im 145/218]
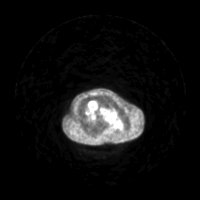

[Series 4: ct sk_thigh 5.0 b31f · axial · 5.0mm · 0.98mm/px · z∈[-881,-13]mm · 5 of 218 slices shown]
[im 1/218  brain]
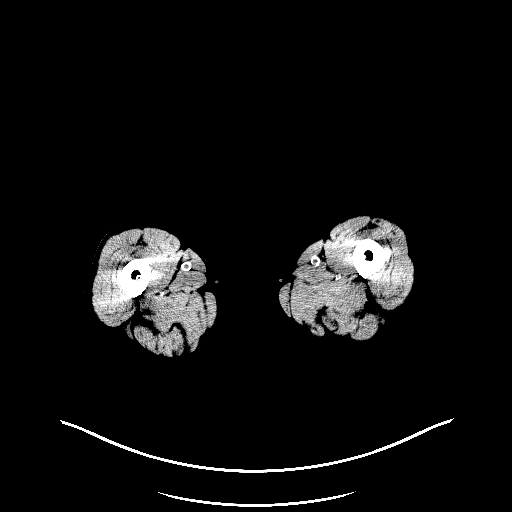
[im 55/218]
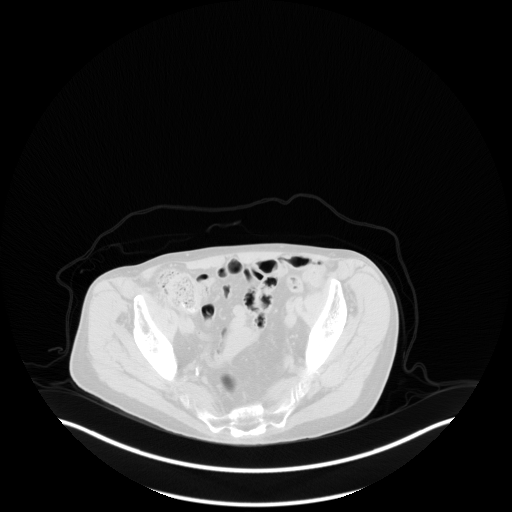
[im 109/218]
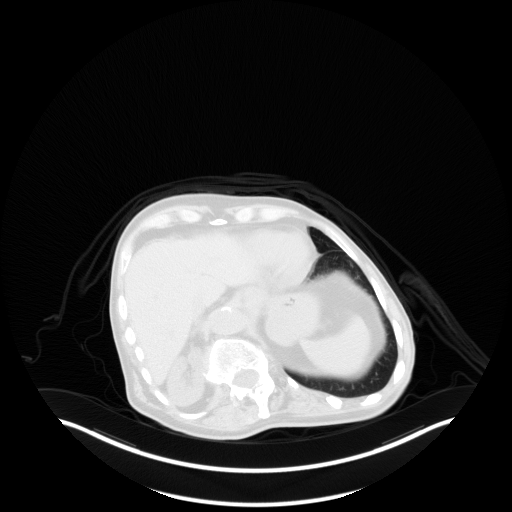
[im 163/218  brain]
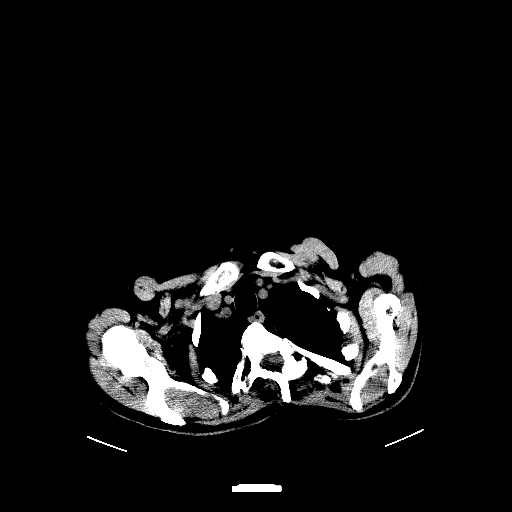
[im 218/218  brain]
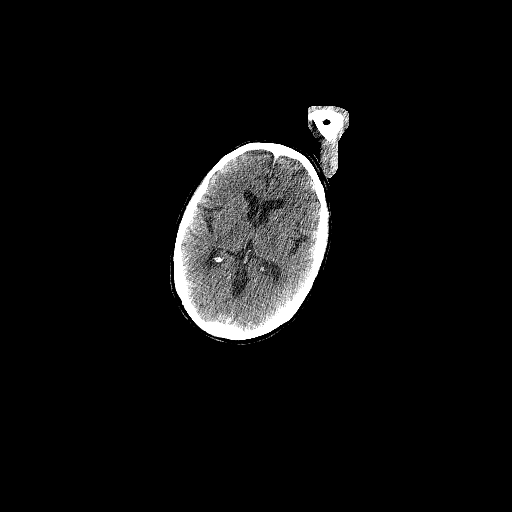

[Series 5: pet sk_thigh nac · axial · 5.0mm · 4.07mm/px · z∈[-665,-13]mm · 4 of 218 slices shown]
[im 55/218]
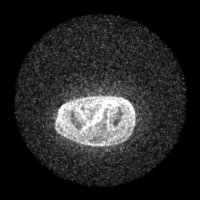
[im 109/218]
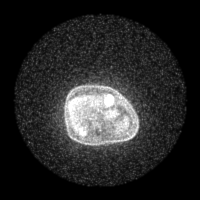
[im 163/218]
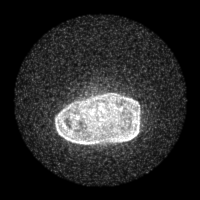
[im 218/218]
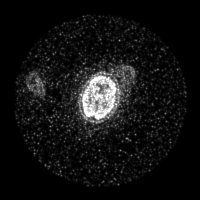

[Series 8: ct sk_thigh 5.0 b70f (id)_bone · axial · 5.0mm · 0.70mm/px · 1 of 80 slices shown]
[im 1/80  bone]
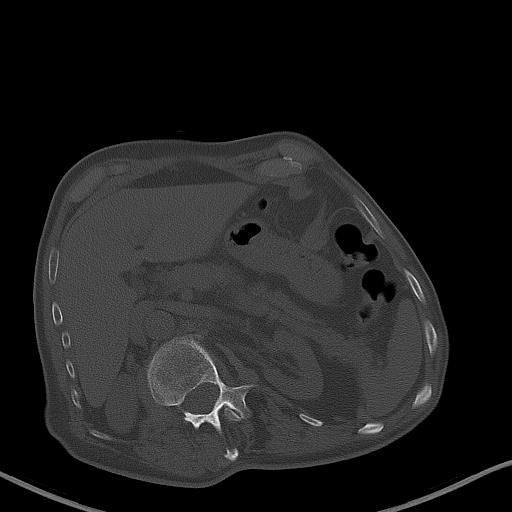

[Series 603: range-ct sk_thigh 5.0 (id)<alpha range> · 2 of 66 slices shown (1 of 2)]
[im 1/66]
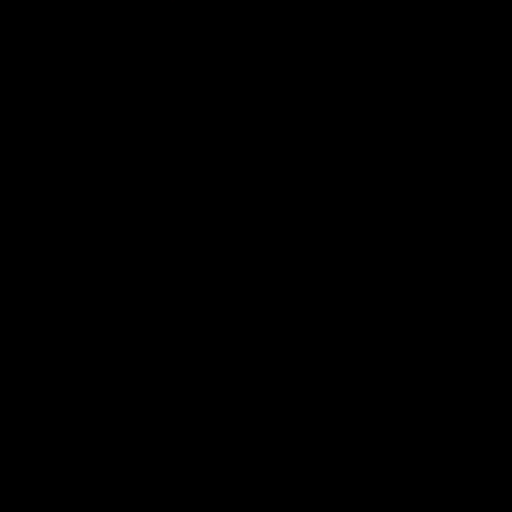
[im 66/66]
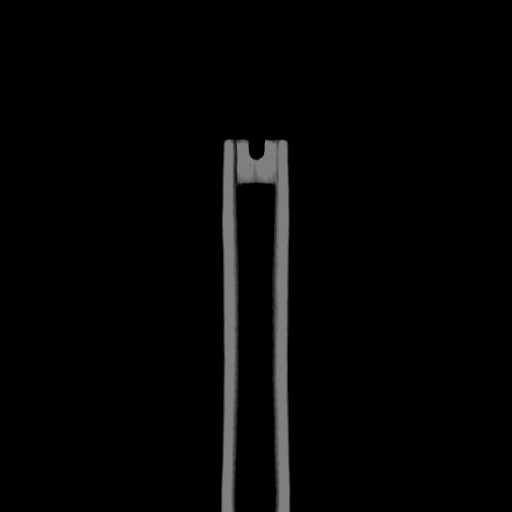

[Series 604: mip range 3 · coronal · 1.80mm/px · 1 of 32 slices shown]
[im 1/32]
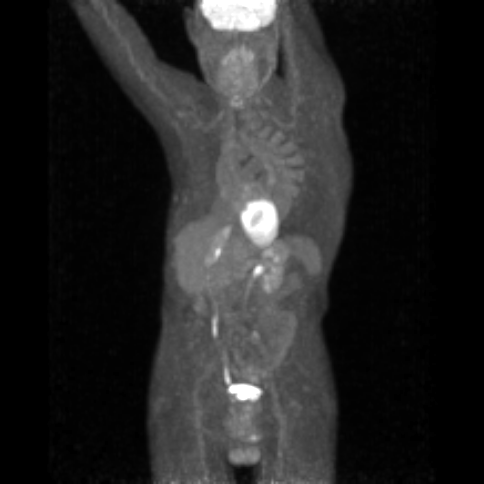

[Series 605: range-ct sk_thigh 5.0 (id)<alpha range> · 4 of 213 slices shown (2 of 2)]
[im 1/213]
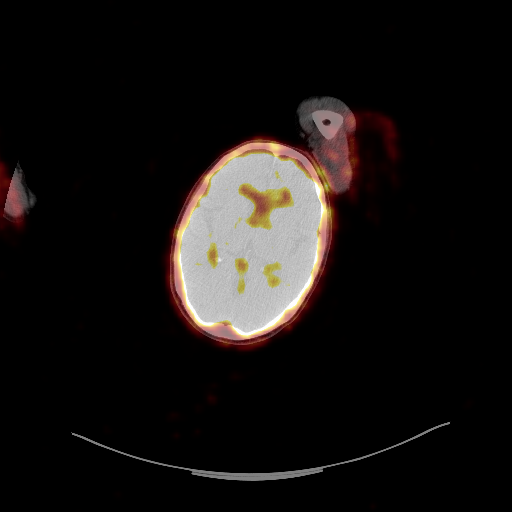
[im 54/213]
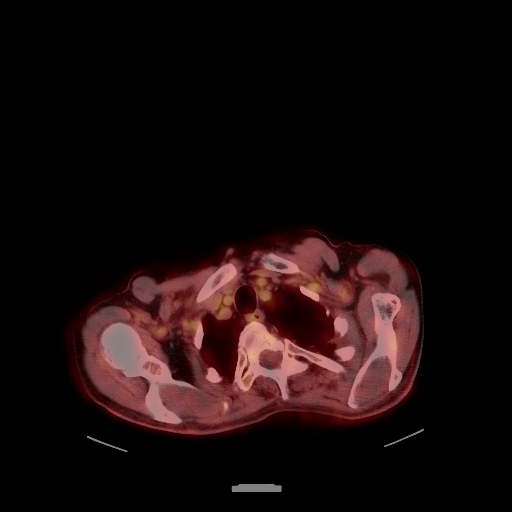
[im 160/213]
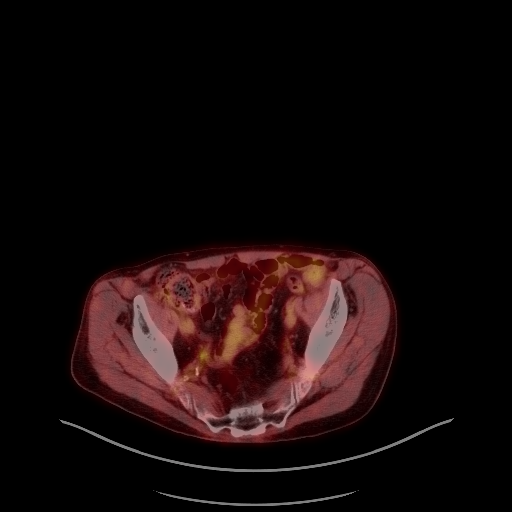
[im 213/213]
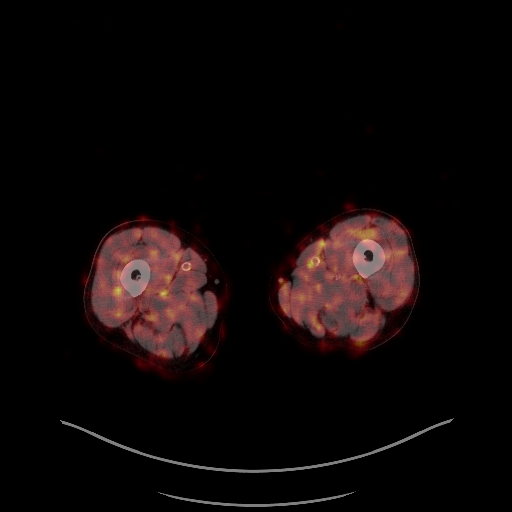

[Series 1054: results mm oncology reading · 1.0mm · 0.50mm/px · 1 of 5 slices shown]
[im 1/5]
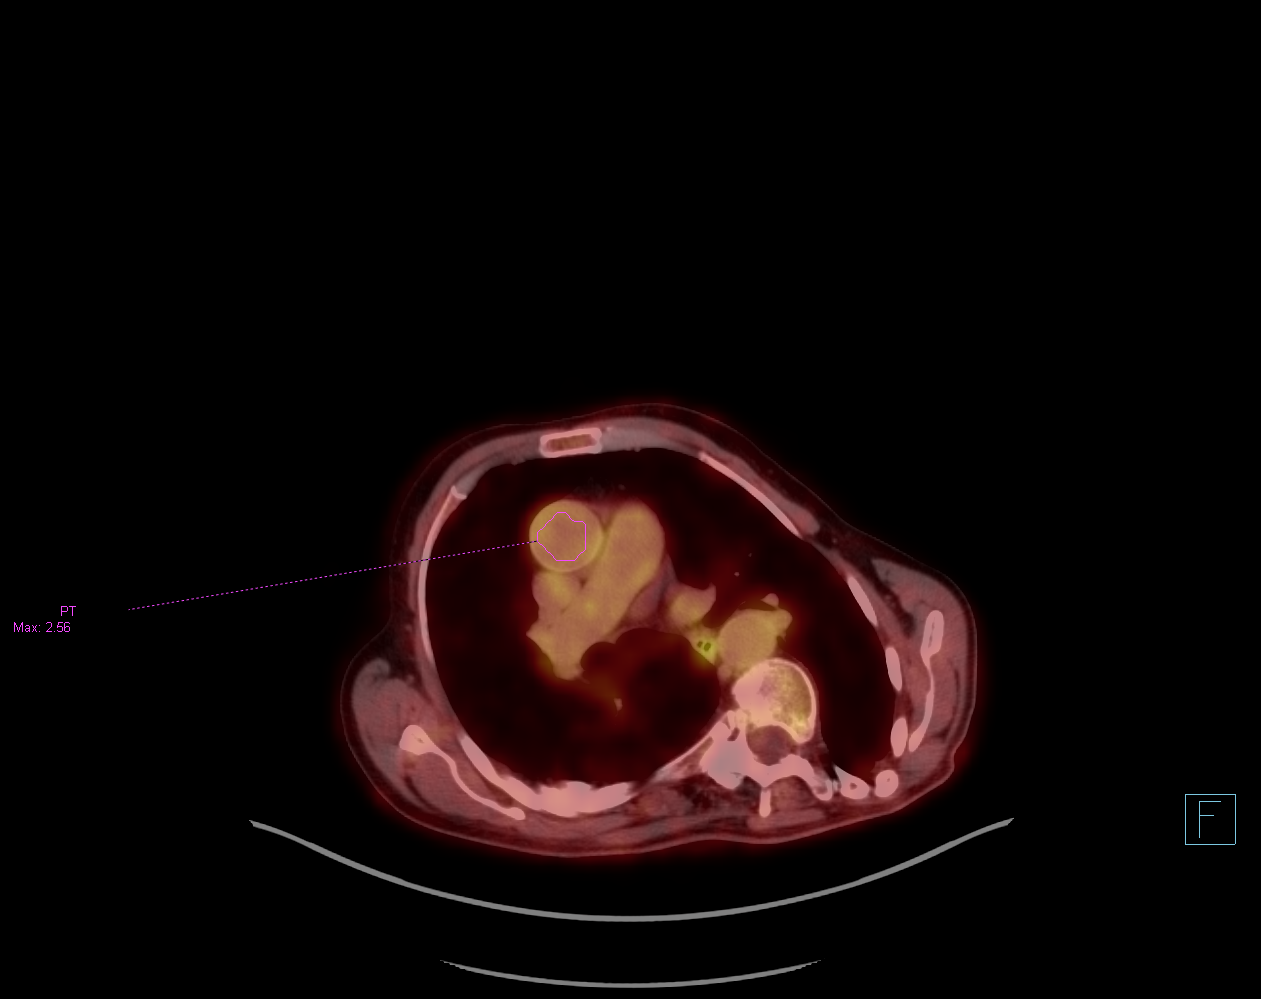

[21 of 25 positions shown; findings below may reference images not displayed]

FINDINGS: Mediastinal blood pool activity: SUV max

NECK: No hypermetabolic lymph nodes in the neck.

Incidental CT findings: none

CHEST: No hypermetabolic pulmonary nodules identified. No
hypermetabolic mediastinal or hilar lymph nodes.

Incidental CT findings: Small nodule within the right upper lobe is
unchanged measuring 3 mm. Too small to reliably characterize. Aortic
atherosclerosis. Calcifications in the LAD coronary artery noted.

ABDOMEN/PELVIS: Stable mild FDG uptake associated with bilateral
adrenal glands. SUV max within the right adrenal gland is equal to
3.5. On the left this equals 3.59. No abnormal radiotracer activity
identified within the liver, pancreas, or spleen. No hypermetabolic
lymph nodes identified within the abdomen.

No hypermetabolic pelvic or inguinal lymph nodes. Approximately
cm hypermetabolic tumor involving the rectum has an SUV max of
16.29. Previously this measured approximately 3.9 cm with an SUV max
of

Incidental CT findings: Aortic atherosclerosis noted.  No aneurysm.

SKELETON: No focal hypermetabolic activity to suggest skeletal
metastasis.

Incidental CT findings: Marked scoliosis deformity is noted
involving the thoracic and lumbar spine.
IMPRESSION: 1. Persistent hypermetabolic mass involving the rectum. This may be
slightly decreased in length compared with previous exam. However,
the degree of intense FDG uptake is not significantly changed.
2. No findings to suggest local lymph node metastasis or distant
metastatic disease.
3. Aortic Atherosclerosis ([id number]). Lad coronary artery
calcifications noted.

## 2018-07-28 DIAGNOSIS — Z9221 Personal history of antineoplastic chemotherapy: Secondary | ICD-10-CM | POA: Diagnosis not present

## 2018-07-28 DIAGNOSIS — Z6821 Body mass index (BMI) 21.0-21.9, adult: Secondary | ICD-10-CM | POA: Diagnosis not present

## 2018-07-28 DIAGNOSIS — C20 Malignant neoplasm of rectum: Secondary | ICD-10-CM | POA: Diagnosis not present

## 2018-08-30 DIAGNOSIS — C20 Malignant neoplasm of rectum: Secondary | ICD-10-CM | POA: Diagnosis not present

## 2018-08-30 DIAGNOSIS — Z6821 Body mass index (BMI) 21.0-21.9, adult: Secondary | ICD-10-CM | POA: Diagnosis not present

## 2018-09-30 DIAGNOSIS — Z6820 Body mass index (BMI) 20.0-20.9, adult: Secondary | ICD-10-CM | POA: Diagnosis not present

## 2018-09-30 DIAGNOSIS — C20 Malignant neoplasm of rectum: Secondary | ICD-10-CM | POA: Diagnosis not present

## 2018-10-15 DIAGNOSIS — E782 Mixed hyperlipidemia: Secondary | ICD-10-CM | POA: Diagnosis not present

## 2018-10-15 DIAGNOSIS — R5383 Other fatigue: Secondary | ICD-10-CM | POA: Diagnosis not present

## 2018-10-15 DIAGNOSIS — I10 Essential (primary) hypertension: Secondary | ICD-10-CM | POA: Diagnosis not present

## 2018-10-15 DIAGNOSIS — E039 Hypothyroidism, unspecified: Secondary | ICD-10-CM | POA: Diagnosis not present

## 2018-10-18 DIAGNOSIS — Z0001 Encounter for general adult medical examination with abnormal findings: Secondary | ICD-10-CM | POA: Diagnosis not present

## 2018-10-18 DIAGNOSIS — E782 Mixed hyperlipidemia: Secondary | ICD-10-CM | POA: Diagnosis not present

## 2018-10-18 DIAGNOSIS — C20 Malignant neoplasm of rectum: Secondary | ICD-10-CM | POA: Diagnosis not present

## 2018-10-18 DIAGNOSIS — I10 Essential (primary) hypertension: Secondary | ICD-10-CM | POA: Diagnosis not present

## 2018-10-18 DIAGNOSIS — M1A00X0 Idiopathic chronic gout, unspecified site, without tophus (tophi): Secondary | ICD-10-CM | POA: Diagnosis not present

## 2018-10-18 DIAGNOSIS — E039 Hypothyroidism, unspecified: Secondary | ICD-10-CM | POA: Diagnosis not present

## 2018-10-21 DIAGNOSIS — C20 Malignant neoplasm of rectum: Secondary | ICD-10-CM | POA: Diagnosis not present

## 2018-10-28 DIAGNOSIS — Z6821 Body mass index (BMI) 21.0-21.9, adult: Secondary | ICD-10-CM | POA: Diagnosis not present

## 2018-10-28 DIAGNOSIS — W19XXXA Unspecified fall, initial encounter: Secondary | ICD-10-CM | POA: Diagnosis not present

## 2018-10-28 DIAGNOSIS — C20 Malignant neoplasm of rectum: Secondary | ICD-10-CM | POA: Diagnosis not present

## 2018-10-28 DIAGNOSIS — R7889 Finding of other specified substances, not normally found in blood: Secondary | ICD-10-CM | POA: Diagnosis not present

## 2018-10-28 DIAGNOSIS — Z5111 Encounter for antineoplastic chemotherapy: Secondary | ICD-10-CM | POA: Diagnosis not present

## 2018-11-01 DIAGNOSIS — R531 Weakness: Secondary | ICD-10-CM | POA: Diagnosis not present

## 2018-11-01 DIAGNOSIS — Z6820 Body mass index (BMI) 20.0-20.9, adult: Secondary | ICD-10-CM | POA: Diagnosis not present

## 2018-11-01 DIAGNOSIS — I951 Orthostatic hypotension: Secondary | ICD-10-CM | POA: Diagnosis not present

## 2018-11-01 DIAGNOSIS — W19XXXA Unspecified fall, initial encounter: Secondary | ICD-10-CM | POA: Diagnosis not present

## 2018-11-11 DIAGNOSIS — Z5111 Encounter for antineoplastic chemotherapy: Secondary | ICD-10-CM | POA: Diagnosis not present

## 2018-11-11 DIAGNOSIS — C20 Malignant neoplasm of rectum: Secondary | ICD-10-CM | POA: Diagnosis not present

## 2018-11-11 DIAGNOSIS — R7889 Finding of other specified substances, not normally found in blood: Secondary | ICD-10-CM | POA: Diagnosis not present

## 2018-11-18 DIAGNOSIS — W19XXXA Unspecified fall, initial encounter: Secondary | ICD-10-CM | POA: Diagnosis not present

## 2018-11-18 DIAGNOSIS — C20 Malignant neoplasm of rectum: Secondary | ICD-10-CM | POA: Diagnosis not present

## 2018-11-18 DIAGNOSIS — Z6821 Body mass index (BMI) 21.0-21.9, adult: Secondary | ICD-10-CM | POA: Diagnosis not present

## 2018-11-18 DIAGNOSIS — R197 Diarrhea, unspecified: Secondary | ICD-10-CM | POA: Diagnosis not present

## 2018-11-18 DIAGNOSIS — D500 Iron deficiency anemia secondary to blood loss (chronic): Secondary | ICD-10-CM | POA: Diagnosis not present

## 2018-11-18 DIAGNOSIS — Z5111 Encounter for antineoplastic chemotherapy: Secondary | ICD-10-CM | POA: Diagnosis not present

## 2018-12-08 DIAGNOSIS — Z5111 Encounter for antineoplastic chemotherapy: Secondary | ICD-10-CM | POA: Diagnosis not present

## 2018-12-08 DIAGNOSIS — Z6821 Body mass index (BMI) 21.0-21.9, adult: Secondary | ICD-10-CM | POA: Diagnosis not present

## 2018-12-08 DIAGNOSIS — D500 Iron deficiency anemia secondary to blood loss (chronic): Secondary | ICD-10-CM | POA: Diagnosis not present

## 2018-12-08 DIAGNOSIS — C20 Malignant neoplasm of rectum: Secondary | ICD-10-CM | POA: Diagnosis not present

## 2019-01-03 DIAGNOSIS — Z6820 Body mass index (BMI) 20.0-20.9, adult: Secondary | ICD-10-CM | POA: Diagnosis not present

## 2019-01-03 DIAGNOSIS — L03031 Cellulitis of right toe: Secondary | ICD-10-CM | POA: Diagnosis not present

## 2019-01-03 DIAGNOSIS — L03032 Cellulitis of left toe: Secondary | ICD-10-CM | POA: Diagnosis not present

## 2019-01-03 DIAGNOSIS — L600 Ingrowing nail: Secondary | ICD-10-CM | POA: Diagnosis not present

## 2019-01-07 DIAGNOSIS — L600 Ingrowing nail: Secondary | ICD-10-CM | POA: Diagnosis not present

## 2019-01-07 DIAGNOSIS — C20 Malignant neoplasm of rectum: Secondary | ICD-10-CM | POA: Diagnosis not present

## 2019-01-07 DIAGNOSIS — Z6821 Body mass index (BMI) 21.0-21.9, adult: Secondary | ICD-10-CM | POA: Diagnosis not present

## 2019-01-07 DIAGNOSIS — R197 Diarrhea, unspecified: Secondary | ICD-10-CM | POA: Diagnosis not present

## 2019-01-07 DIAGNOSIS — Z5111 Encounter for antineoplastic chemotherapy: Secondary | ICD-10-CM | POA: Diagnosis not present

## 2019-01-12 DIAGNOSIS — L600 Ingrowing nail: Secondary | ICD-10-CM | POA: Diagnosis not present

## 2019-01-22 DIAGNOSIS — L600 Ingrowing nail: Secondary | ICD-10-CM | POA: Diagnosis not present

## 2019-01-26 DIAGNOSIS — D500 Iron deficiency anemia secondary to blood loss (chronic): Secondary | ICD-10-CM | POA: Diagnosis not present

## 2019-01-26 DIAGNOSIS — C20 Malignant neoplasm of rectum: Secondary | ICD-10-CM | POA: Diagnosis not present

## 2019-02-02 DIAGNOSIS — D500 Iron deficiency anemia secondary to blood loss (chronic): Secondary | ICD-10-CM | POA: Diagnosis not present

## 2019-02-02 DIAGNOSIS — C20 Malignant neoplasm of rectum: Secondary | ICD-10-CM | POA: Diagnosis not present

## 2019-02-02 DIAGNOSIS — Z6821 Body mass index (BMI) 21.0-21.9, adult: Secondary | ICD-10-CM | POA: Diagnosis not present

## 2019-02-02 DIAGNOSIS — L600 Ingrowing nail: Secondary | ICD-10-CM | POA: Diagnosis not present

## 2019-02-02 DIAGNOSIS — Z5111 Encounter for antineoplastic chemotherapy: Secondary | ICD-10-CM | POA: Diagnosis not present

## 2019-02-02 DIAGNOSIS — R197 Diarrhea, unspecified: Secondary | ICD-10-CM | POA: Diagnosis not present

## 2019-02-03 DIAGNOSIS — Z23 Encounter for immunization: Secondary | ICD-10-CM | POA: Diagnosis not present

## 2019-02-23 DIAGNOSIS — C20 Malignant neoplasm of rectum: Secondary | ICD-10-CM | POA: Diagnosis not present

## 2019-02-23 DIAGNOSIS — D500 Iron deficiency anemia secondary to blood loss (chronic): Secondary | ICD-10-CM | POA: Diagnosis not present

## 2019-03-01 DIAGNOSIS — L600 Ingrowing nail: Secondary | ICD-10-CM | POA: Diagnosis not present

## 2019-03-01 DIAGNOSIS — D500 Iron deficiency anemia secondary to blood loss (chronic): Secondary | ICD-10-CM | POA: Diagnosis not present

## 2019-03-01 DIAGNOSIS — Z923 Personal history of irradiation: Secondary | ICD-10-CM | POA: Diagnosis not present

## 2019-03-01 DIAGNOSIS — C20 Malignant neoplasm of rectum: Secondary | ICD-10-CM | POA: Diagnosis not present

## 2019-03-01 DIAGNOSIS — Z5111 Encounter for antineoplastic chemotherapy: Secondary | ICD-10-CM | POA: Diagnosis not present

## 2019-03-01 DIAGNOSIS — Z6821 Body mass index (BMI) 21.0-21.9, adult: Secondary | ICD-10-CM | POA: Diagnosis not present

## 2019-03-16 DIAGNOSIS — Z6821 Body mass index (BMI) 21.0-21.9, adult: Secondary | ICD-10-CM | POA: Diagnosis not present

## 2019-03-16 DIAGNOSIS — Z923 Personal history of irradiation: Secondary | ICD-10-CM | POA: Diagnosis not present

## 2019-03-16 DIAGNOSIS — R079 Chest pain, unspecified: Secondary | ICD-10-CM | POA: Diagnosis not present

## 2019-03-16 DIAGNOSIS — Z5111 Encounter for antineoplastic chemotherapy: Secondary | ICD-10-CM | POA: Diagnosis not present

## 2019-03-16 DIAGNOSIS — C20 Malignant neoplasm of rectum: Secondary | ICD-10-CM | POA: Diagnosis not present

## 2019-03-16 DIAGNOSIS — L600 Ingrowing nail: Secondary | ICD-10-CM | POA: Diagnosis not present

## 2019-03-16 DIAGNOSIS — R0600 Dyspnea, unspecified: Secondary | ICD-10-CM | POA: Diagnosis not present

## 2019-03-16 DIAGNOSIS — R0602 Shortness of breath: Secondary | ICD-10-CM | POA: Diagnosis not present

## 2019-03-16 DIAGNOSIS — D500 Iron deficiency anemia secondary to blood loss (chronic): Secondary | ICD-10-CM | POA: Diagnosis not present

## 2019-03-22 DIAGNOSIS — R0600 Dyspnea, unspecified: Secondary | ICD-10-CM | POA: Diagnosis not present

## 2019-03-22 DIAGNOSIS — R0609 Other forms of dyspnea: Secondary | ICD-10-CM | POA: Diagnosis not present

## 2019-03-23 DIAGNOSIS — Z5111 Encounter for antineoplastic chemotherapy: Secondary | ICD-10-CM | POA: Diagnosis not present

## 2019-03-23 DIAGNOSIS — C20 Malignant neoplasm of rectum: Secondary | ICD-10-CM | POA: Diagnosis not present

## 2019-03-23 DIAGNOSIS — D500 Iron deficiency anemia secondary to blood loss (chronic): Secondary | ICD-10-CM | POA: Diagnosis not present

## 2019-03-23 DIAGNOSIS — Z6821 Body mass index (BMI) 21.0-21.9, adult: Secondary | ICD-10-CM | POA: Diagnosis not present

## 2019-04-13 DIAGNOSIS — Z6821 Body mass index (BMI) 21.0-21.9, adult: Secondary | ICD-10-CM | POA: Diagnosis not present

## 2019-04-13 DIAGNOSIS — M79672 Pain in left foot: Secondary | ICD-10-CM | POA: Diagnosis not present

## 2019-04-13 DIAGNOSIS — C20 Malignant neoplasm of rectum: Secondary | ICD-10-CM | POA: Diagnosis not present

## 2019-04-13 DIAGNOSIS — M79671 Pain in right foot: Secondary | ICD-10-CM | POA: Diagnosis not present

## 2019-04-13 DIAGNOSIS — Z5111 Encounter for antineoplastic chemotherapy: Secondary | ICD-10-CM | POA: Diagnosis not present

## 2019-04-19 DIAGNOSIS — Z6821 Body mass index (BMI) 21.0-21.9, adult: Secondary | ICD-10-CM | POA: Diagnosis not present

## 2019-04-19 DIAGNOSIS — M79672 Pain in left foot: Secondary | ICD-10-CM | POA: Diagnosis not present

## 2019-04-26 DIAGNOSIS — C218 Malignant neoplasm of overlapping sites of rectum, anus and anal canal: Secondary | ICD-10-CM | POA: Diagnosis not present

## 2019-04-26 DIAGNOSIS — C20 Malignant neoplasm of rectum: Secondary | ICD-10-CM | POA: Diagnosis not present

## 2019-04-26 DIAGNOSIS — I700 Atherosclerosis of aorta: Secondary | ICD-10-CM | POA: Diagnosis not present

## 2019-04-27 DIAGNOSIS — L820 Inflamed seborrheic keratosis: Secondary | ICD-10-CM | POA: Diagnosis not present

## 2019-04-29 DIAGNOSIS — Z452 Encounter for adjustment and management of vascular access device: Secondary | ICD-10-CM | POA: Diagnosis not present

## 2019-04-29 DIAGNOSIS — C20 Malignant neoplasm of rectum: Secondary | ICD-10-CM | POA: Diagnosis not present

## 2019-04-29 DIAGNOSIS — Z6822 Body mass index (BMI) 22.0-22.9, adult: Secondary | ICD-10-CM | POA: Diagnosis not present

## 2019-05-23 DIAGNOSIS — Z6821 Body mass index (BMI) 21.0-21.9, adult: Secondary | ICD-10-CM | POA: Diagnosis not present

## 2019-05-23 DIAGNOSIS — S90822A Blister (nonthermal), left foot, initial encounter: Secondary | ICD-10-CM | POA: Diagnosis not present

## 2019-05-23 DIAGNOSIS — C20 Malignant neoplasm of rectum: Secondary | ICD-10-CM | POA: Diagnosis not present

## 2019-05-23 DIAGNOSIS — S90821A Blister (nonthermal), right foot, initial encounter: Secondary | ICD-10-CM | POA: Diagnosis not present

## 2019-05-25 DIAGNOSIS — Z9221 Personal history of antineoplastic chemotherapy: Secondary | ICD-10-CM | POA: Diagnosis not present

## 2019-05-25 DIAGNOSIS — C20 Malignant neoplasm of rectum: Secondary | ICD-10-CM | POA: Diagnosis not present

## 2019-05-25 DIAGNOSIS — L271 Localized skin eruption due to drugs and medicaments taken internally: Secondary | ICD-10-CM | POA: Diagnosis not present

## 2019-06-03 DIAGNOSIS — R238 Other skin changes: Secondary | ICD-10-CM | POA: Diagnosis not present

## 2019-06-15 DIAGNOSIS — M419 Scoliosis, unspecified: Secondary | ICD-10-CM | POA: Diagnosis not present

## 2019-06-15 DIAGNOSIS — E039 Hypothyroidism, unspecified: Secondary | ICD-10-CM | POA: Diagnosis not present

## 2019-06-15 DIAGNOSIS — Z01818 Encounter for other preprocedural examination: Secondary | ICD-10-CM | POA: Diagnosis not present

## 2019-06-15 DIAGNOSIS — K219 Gastro-esophageal reflux disease without esophagitis: Secondary | ICD-10-CM | POA: Diagnosis not present

## 2019-06-15 DIAGNOSIS — I10 Essential (primary) hypertension: Secondary | ICD-10-CM | POA: Diagnosis not present

## 2019-06-15 DIAGNOSIS — C20 Malignant neoplasm of rectum: Secondary | ICD-10-CM | POA: Diagnosis not present

## 2019-06-16 DIAGNOSIS — Z5111 Encounter for antineoplastic chemotherapy: Secondary | ICD-10-CM | POA: Diagnosis not present

## 2019-06-16 DIAGNOSIS — C20 Malignant neoplasm of rectum: Secondary | ICD-10-CM | POA: Diagnosis not present

## 2019-06-16 DIAGNOSIS — Z6821 Body mass index (BMI) 21.0-21.9, adult: Secondary | ICD-10-CM | POA: Diagnosis not present

## 2019-06-17 DIAGNOSIS — K219 Gastro-esophageal reflux disease without esophagitis: Secondary | ICD-10-CM | POA: Diagnosis not present

## 2019-06-17 DIAGNOSIS — C218 Malignant neoplasm of overlapping sites of rectum, anus and anal canal: Secondary | ICD-10-CM | POA: Diagnosis not present

## 2019-06-17 DIAGNOSIS — M419 Scoliosis, unspecified: Secondary | ICD-10-CM | POA: Diagnosis not present

## 2019-06-17 DIAGNOSIS — Z452 Encounter for adjustment and management of vascular access device: Secondary | ICD-10-CM | POA: Diagnosis not present

## 2019-06-17 DIAGNOSIS — E039 Hypothyroidism, unspecified: Secondary | ICD-10-CM | POA: Diagnosis not present

## 2019-06-17 DIAGNOSIS — I10 Essential (primary) hypertension: Secondary | ICD-10-CM | POA: Diagnosis not present

## 2019-06-17 DIAGNOSIS — C20 Malignant neoplasm of rectum: Secondary | ICD-10-CM | POA: Diagnosis not present

## 2019-06-20 DIAGNOSIS — R5383 Other fatigue: Secondary | ICD-10-CM | POA: Diagnosis not present

## 2019-06-20 DIAGNOSIS — I10 Essential (primary) hypertension: Secondary | ICD-10-CM | POA: Diagnosis not present

## 2019-06-20 DIAGNOSIS — C20 Malignant neoplasm of rectum: Secondary | ICD-10-CM | POA: Diagnosis not present

## 2019-06-20 DIAGNOSIS — E039 Hypothyroidism, unspecified: Secondary | ICD-10-CM | POA: Diagnosis not present

## 2019-06-20 DIAGNOSIS — M1990 Unspecified osteoarthritis, unspecified site: Secondary | ICD-10-CM | POA: Diagnosis not present

## 2019-06-20 DIAGNOSIS — E782 Mixed hyperlipidemia: Secondary | ICD-10-CM | POA: Diagnosis not present

## 2019-06-23 DIAGNOSIS — E039 Hypothyroidism, unspecified: Secondary | ICD-10-CM | POA: Diagnosis not present

## 2019-06-23 DIAGNOSIS — I10 Essential (primary) hypertension: Secondary | ICD-10-CM | POA: Diagnosis not present

## 2019-06-23 DIAGNOSIS — Z6821 Body mass index (BMI) 21.0-21.9, adult: Secondary | ICD-10-CM | POA: Diagnosis not present

## 2019-06-23 DIAGNOSIS — M1A00X0 Idiopathic chronic gout, unspecified site, without tophus (tophi): Secondary | ICD-10-CM | POA: Diagnosis not present

## 2019-06-23 DIAGNOSIS — C20 Malignant neoplasm of rectum: Secondary | ICD-10-CM | POA: Diagnosis not present

## 2019-06-23 DIAGNOSIS — E782 Mixed hyperlipidemia: Secondary | ICD-10-CM | POA: Diagnosis not present

## 2019-06-28 DIAGNOSIS — C20 Malignant neoplasm of rectum: Secondary | ICD-10-CM | POA: Diagnosis not present

## 2019-06-28 DIAGNOSIS — Z452 Encounter for adjustment and management of vascular access device: Secondary | ICD-10-CM | POA: Diagnosis not present

## 2019-06-30 DIAGNOSIS — Z923 Personal history of irradiation: Secondary | ICD-10-CM | POA: Diagnosis not present

## 2019-06-30 DIAGNOSIS — Z6821 Body mass index (BMI) 21.0-21.9, adult: Secondary | ICD-10-CM | POA: Diagnosis not present

## 2019-06-30 DIAGNOSIS — R197 Diarrhea, unspecified: Secondary | ICD-10-CM | POA: Diagnosis not present

## 2019-06-30 DIAGNOSIS — W19XXXA Unspecified fall, initial encounter: Secondary | ICD-10-CM | POA: Diagnosis not present

## 2019-06-30 DIAGNOSIS — D500 Iron deficiency anemia secondary to blood loss (chronic): Secondary | ICD-10-CM | POA: Diagnosis not present

## 2019-06-30 DIAGNOSIS — C20 Malignant neoplasm of rectum: Secondary | ICD-10-CM | POA: Diagnosis not present

## 2019-06-30 DIAGNOSIS — L271 Localized skin eruption due to drugs and medicaments taken internally: Secondary | ICD-10-CM | POA: Diagnosis not present

## 2019-06-30 DIAGNOSIS — Z9221 Personal history of antineoplastic chemotherapy: Secondary | ICD-10-CM | POA: Diagnosis not present

## 2019-07-05 DIAGNOSIS — C20 Malignant neoplasm of rectum: Secondary | ICD-10-CM | POA: Diagnosis not present

## 2019-07-05 DIAGNOSIS — Z5111 Encounter for antineoplastic chemotherapy: Secondary | ICD-10-CM | POA: Diagnosis not present

## 2019-07-18 DIAGNOSIS — K1231 Oral mucositis (ulcerative) due to antineoplastic therapy: Secondary | ICD-10-CM | POA: Diagnosis not present

## 2019-07-18 DIAGNOSIS — C20 Malignant neoplasm of rectum: Secondary | ICD-10-CM | POA: Diagnosis not present

## 2019-07-18 DIAGNOSIS — Z923 Personal history of irradiation: Secondary | ICD-10-CM | POA: Diagnosis not present

## 2019-07-18 DIAGNOSIS — Z5111 Encounter for antineoplastic chemotherapy: Secondary | ICD-10-CM | POA: Diagnosis not present

## 2019-07-18 DIAGNOSIS — Z6821 Body mass index (BMI) 21.0-21.9, adult: Secondary | ICD-10-CM | POA: Diagnosis not present

## 2019-07-19 DIAGNOSIS — C20 Malignant neoplasm of rectum: Secondary | ICD-10-CM | POA: Diagnosis not present

## 2019-07-19 DIAGNOSIS — Z5111 Encounter for antineoplastic chemotherapy: Secondary | ICD-10-CM | POA: Diagnosis not present

## 2019-07-21 DIAGNOSIS — C20 Malignant neoplasm of rectum: Secondary | ICD-10-CM | POA: Diagnosis not present

## 2019-07-29 DIAGNOSIS — Z6821 Body mass index (BMI) 21.0-21.9, adult: Secondary | ICD-10-CM | POA: Diagnosis not present

## 2019-07-29 DIAGNOSIS — K047 Periapical abscess without sinus: Secondary | ICD-10-CM | POA: Diagnosis not present

## 2019-07-29 DIAGNOSIS — C20 Malignant neoplasm of rectum: Secondary | ICD-10-CM | POA: Diagnosis not present

## 2019-07-29 DIAGNOSIS — Z5111 Encounter for antineoplastic chemotherapy: Secondary | ICD-10-CM | POA: Diagnosis not present

## 2019-08-05 DIAGNOSIS — Z5111 Encounter for antineoplastic chemotherapy: Secondary | ICD-10-CM | POA: Diagnosis not present

## 2019-08-05 DIAGNOSIS — C20 Malignant neoplasm of rectum: Secondary | ICD-10-CM | POA: Diagnosis not present

## 2019-08-08 DIAGNOSIS — K047 Periapical abscess without sinus: Secondary | ICD-10-CM | POA: Diagnosis not present

## 2019-08-08 DIAGNOSIS — Z6821 Body mass index (BMI) 21.0-21.9, adult: Secondary | ICD-10-CM | POA: Diagnosis not present

## 2019-08-08 DIAGNOSIS — C20 Malignant neoplasm of rectum: Secondary | ICD-10-CM | POA: Diagnosis not present

## 2019-08-08 DIAGNOSIS — Z5111 Encounter for antineoplastic chemotherapy: Secondary | ICD-10-CM | POA: Diagnosis not present

## 2019-08-18 DIAGNOSIS — Z5111 Encounter for antineoplastic chemotherapy: Secondary | ICD-10-CM | POA: Diagnosis not present

## 2019-08-18 DIAGNOSIS — Z6821 Body mass index (BMI) 21.0-21.9, adult: Secondary | ICD-10-CM | POA: Diagnosis not present

## 2019-08-18 DIAGNOSIS — K047 Periapical abscess without sinus: Secondary | ICD-10-CM | POA: Diagnosis not present

## 2019-08-18 DIAGNOSIS — C20 Malignant neoplasm of rectum: Secondary | ICD-10-CM | POA: Diagnosis not present

## 2019-08-23 DIAGNOSIS — Z5111 Encounter for antineoplastic chemotherapy: Secondary | ICD-10-CM | POA: Diagnosis not present

## 2019-08-23 DIAGNOSIS — C20 Malignant neoplasm of rectum: Secondary | ICD-10-CM | POA: Diagnosis not present

## 2019-08-25 DIAGNOSIS — C20 Malignant neoplasm of rectum: Secondary | ICD-10-CM | POA: Diagnosis not present

## 2019-09-01 DIAGNOSIS — T451X5A Adverse effect of antineoplastic and immunosuppressive drugs, initial encounter: Secondary | ICD-10-CM | POA: Diagnosis not present

## 2019-09-01 DIAGNOSIS — C20 Malignant neoplasm of rectum: Secondary | ICD-10-CM | POA: Diagnosis not present

## 2019-09-01 DIAGNOSIS — R110 Nausea: Secondary | ICD-10-CM | POA: Diagnosis not present

## 2019-09-01 DIAGNOSIS — Z6821 Body mass index (BMI) 21.0-21.9, adult: Secondary | ICD-10-CM | POA: Diagnosis not present

## 2019-09-01 DIAGNOSIS — Z5111 Encounter for antineoplastic chemotherapy: Secondary | ICD-10-CM | POA: Diagnosis not present

## 2019-09-06 DIAGNOSIS — C20 Malignant neoplasm of rectum: Secondary | ICD-10-CM | POA: Diagnosis not present

## 2019-09-06 DIAGNOSIS — Z5111 Encounter for antineoplastic chemotherapy: Secondary | ICD-10-CM | POA: Diagnosis not present

## 2019-09-08 DIAGNOSIS — C20 Malignant neoplasm of rectum: Secondary | ICD-10-CM | POA: Diagnosis not present

## 2019-09-15 DIAGNOSIS — Z5111 Encounter for antineoplastic chemotherapy: Secondary | ICD-10-CM | POA: Diagnosis not present

## 2019-09-15 DIAGNOSIS — C20 Malignant neoplasm of rectum: Secondary | ICD-10-CM | POA: Diagnosis not present

## 2019-09-15 DIAGNOSIS — Z6821 Body mass index (BMI) 21.0-21.9, adult: Secondary | ICD-10-CM | POA: Diagnosis not present

## 2019-09-20 DIAGNOSIS — Z5111 Encounter for antineoplastic chemotherapy: Secondary | ICD-10-CM | POA: Diagnosis not present

## 2019-09-20 DIAGNOSIS — C20 Malignant neoplasm of rectum: Secondary | ICD-10-CM | POA: Diagnosis not present

## 2019-09-30 DIAGNOSIS — Z5111 Encounter for antineoplastic chemotherapy: Secondary | ICD-10-CM | POA: Diagnosis not present

## 2019-09-30 DIAGNOSIS — G620 Drug-induced polyneuropathy: Secondary | ICD-10-CM | POA: Diagnosis not present

## 2019-09-30 DIAGNOSIS — Z6821 Body mass index (BMI) 21.0-21.9, adult: Secondary | ICD-10-CM | POA: Diagnosis not present

## 2019-09-30 DIAGNOSIS — C20 Malignant neoplasm of rectum: Secondary | ICD-10-CM | POA: Diagnosis not present

## 2019-09-30 DIAGNOSIS — T451X5A Adverse effect of antineoplastic and immunosuppressive drugs, initial encounter: Secondary | ICD-10-CM | POA: Diagnosis not present

## 2019-10-07 DIAGNOSIS — Z5111 Encounter for antineoplastic chemotherapy: Secondary | ICD-10-CM | POA: Diagnosis not present

## 2019-10-07 DIAGNOSIS — C20 Malignant neoplasm of rectum: Secondary | ICD-10-CM | POA: Diagnosis not present

## 2019-10-14 DIAGNOSIS — C20 Malignant neoplasm of rectum: Secondary | ICD-10-CM | POA: Diagnosis not present

## 2019-10-19 DIAGNOSIS — C20 Malignant neoplasm of rectum: Secondary | ICD-10-CM | POA: Diagnosis not present

## 2019-10-31 DIAGNOSIS — Z5111 Encounter for antineoplastic chemotherapy: Secondary | ICD-10-CM | POA: Diagnosis not present

## 2019-10-31 DIAGNOSIS — C20 Malignant neoplasm of rectum: Secondary | ICD-10-CM | POA: Diagnosis not present

## 2019-11-17 DIAGNOSIS — C20 Malignant neoplasm of rectum: Secondary | ICD-10-CM | POA: Diagnosis not present

## 2019-11-17 DIAGNOSIS — Z5111 Encounter for antineoplastic chemotherapy: Secondary | ICD-10-CM | POA: Diagnosis not present

## 2019-11-25 DIAGNOSIS — E039 Hypothyroidism, unspecified: Secondary | ICD-10-CM | POA: Diagnosis not present

## 2019-11-25 DIAGNOSIS — M1A00X0 Idiopathic chronic gout, unspecified site, without tophus (tophi): Secondary | ICD-10-CM | POA: Diagnosis not present

## 2019-11-25 DIAGNOSIS — Z6820 Body mass index (BMI) 20.0-20.9, adult: Secondary | ICD-10-CM | POA: Diagnosis not present

## 2019-11-25 DIAGNOSIS — I10 Essential (primary) hypertension: Secondary | ICD-10-CM | POA: Diagnosis not present

## 2019-11-25 DIAGNOSIS — Z0001 Encounter for general adult medical examination with abnormal findings: Secondary | ICD-10-CM | POA: Diagnosis not present

## 2019-11-25 DIAGNOSIS — C20 Malignant neoplasm of rectum: Secondary | ICD-10-CM | POA: Diagnosis not present

## 2019-11-25 DIAGNOSIS — R4582 Worries: Secondary | ICD-10-CM | POA: Diagnosis not present

## 2019-11-25 DIAGNOSIS — E782 Mixed hyperlipidemia: Secondary | ICD-10-CM | POA: Diagnosis not present

## 2019-12-02 DIAGNOSIS — C20 Malignant neoplasm of rectum: Secondary | ICD-10-CM | POA: Diagnosis not present

## 2019-12-02 DIAGNOSIS — Z5111 Encounter for antineoplastic chemotherapy: Secondary | ICD-10-CM | POA: Diagnosis not present

## 2019-12-06 DIAGNOSIS — Z5111 Encounter for antineoplastic chemotherapy: Secondary | ICD-10-CM | POA: Diagnosis not present

## 2019-12-06 DIAGNOSIS — C20 Malignant neoplasm of rectum: Secondary | ICD-10-CM | POA: Diagnosis not present

## 2019-12-08 DIAGNOSIS — C20 Malignant neoplasm of rectum: Secondary | ICD-10-CM | POA: Diagnosis not present

## 2019-12-08 DIAGNOSIS — Z79899 Other long term (current) drug therapy: Secondary | ICD-10-CM | POA: Diagnosis not present

## 2019-12-16 DIAGNOSIS — G620 Drug-induced polyneuropathy: Secondary | ICD-10-CM | POA: Diagnosis not present

## 2019-12-16 DIAGNOSIS — L600 Ingrowing nail: Secondary | ICD-10-CM | POA: Diagnosis not present

## 2019-12-16 DIAGNOSIS — Z5111 Encounter for antineoplastic chemotherapy: Secondary | ICD-10-CM | POA: Diagnosis not present

## 2019-12-16 DIAGNOSIS — C20 Malignant neoplasm of rectum: Secondary | ICD-10-CM | POA: Diagnosis not present

## 2019-12-16 DIAGNOSIS — T451X5A Adverse effect of antineoplastic and immunosuppressive drugs, initial encounter: Secondary | ICD-10-CM | POA: Diagnosis not present

## 2019-12-16 DIAGNOSIS — M79672 Pain in left foot: Secondary | ICD-10-CM | POA: Diagnosis not present

## 2019-12-16 DIAGNOSIS — Z6821 Body mass index (BMI) 21.0-21.9, adult: Secondary | ICD-10-CM | POA: Diagnosis not present

## 2019-12-23 DIAGNOSIS — C20 Malignant neoplasm of rectum: Secondary | ICD-10-CM | POA: Diagnosis not present

## 2019-12-23 DIAGNOSIS — G620 Drug-induced polyneuropathy: Secondary | ICD-10-CM | POA: Diagnosis not present

## 2019-12-23 DIAGNOSIS — Z6821 Body mass index (BMI) 21.0-21.9, adult: Secondary | ICD-10-CM | POA: Diagnosis not present

## 2019-12-23 DIAGNOSIS — T451X5A Adverse effect of antineoplastic and immunosuppressive drugs, initial encounter: Secondary | ICD-10-CM | POA: Diagnosis not present

## 2019-12-23 DIAGNOSIS — Z5111 Encounter for antineoplastic chemotherapy: Secondary | ICD-10-CM | POA: Diagnosis not present

## 2019-12-26 DIAGNOSIS — M79674 Pain in right toe(s): Secondary | ICD-10-CM | POA: Diagnosis not present

## 2019-12-26 DIAGNOSIS — M79671 Pain in right foot: Secondary | ICD-10-CM | POA: Diagnosis not present

## 2019-12-26 DIAGNOSIS — L03031 Cellulitis of right toe: Secondary | ICD-10-CM | POA: Diagnosis not present

## 2019-12-26 DIAGNOSIS — L600 Ingrowing nail: Secondary | ICD-10-CM | POA: Diagnosis not present

## 2019-12-27 DIAGNOSIS — C20 Malignant neoplasm of rectum: Secondary | ICD-10-CM | POA: Diagnosis not present

## 2020-01-06 DIAGNOSIS — T451X5A Adverse effect of antineoplastic and immunosuppressive drugs, initial encounter: Secondary | ICD-10-CM | POA: Diagnosis not present

## 2020-01-06 DIAGNOSIS — R197 Diarrhea, unspecified: Secondary | ICD-10-CM | POA: Diagnosis not present

## 2020-01-06 DIAGNOSIS — C20 Malignant neoplasm of rectum: Secondary | ICD-10-CM | POA: Diagnosis not present

## 2020-01-06 DIAGNOSIS — R634 Abnormal weight loss: Secondary | ICD-10-CM | POA: Diagnosis not present

## 2020-01-06 DIAGNOSIS — Z6821 Body mass index (BMI) 21.0-21.9, adult: Secondary | ICD-10-CM | POA: Diagnosis not present

## 2020-01-06 DIAGNOSIS — Z5111 Encounter for antineoplastic chemotherapy: Secondary | ICD-10-CM | POA: Diagnosis not present

## 2020-01-06 DIAGNOSIS — Z09 Encounter for follow-up examination after completed treatment for conditions other than malignant neoplasm: Secondary | ICD-10-CM | POA: Diagnosis not present

## 2020-01-06 DIAGNOSIS — G620 Drug-induced polyneuropathy: Secondary | ICD-10-CM | POA: Diagnosis not present

## 2020-01-09 DIAGNOSIS — L03031 Cellulitis of right toe: Secondary | ICD-10-CM | POA: Diagnosis not present

## 2020-01-09 DIAGNOSIS — M79674 Pain in right toe(s): Secondary | ICD-10-CM | POA: Diagnosis not present

## 2020-01-09 DIAGNOSIS — L600 Ingrowing nail: Secondary | ICD-10-CM | POA: Diagnosis not present

## 2020-01-09 DIAGNOSIS — M79671 Pain in right foot: Secondary | ICD-10-CM | POA: Diagnosis not present

## 2020-01-13 DIAGNOSIS — L820 Inflamed seborrheic keratosis: Secondary | ICD-10-CM | POA: Diagnosis not present

## 2020-01-13 DIAGNOSIS — M79652 Pain in left thigh: Secondary | ICD-10-CM | POA: Diagnosis not present

## 2020-01-13 DIAGNOSIS — D485 Neoplasm of uncertain behavior of skin: Secondary | ICD-10-CM | POA: Diagnosis not present

## 2020-01-13 DIAGNOSIS — C20 Malignant neoplasm of rectum: Secondary | ICD-10-CM | POA: Diagnosis not present

## 2020-01-17 DIAGNOSIS — Z23 Encounter for immunization: Secondary | ICD-10-CM | POA: Diagnosis not present

## 2020-01-20 DIAGNOSIS — C20 Malignant neoplasm of rectum: Secondary | ICD-10-CM | POA: Diagnosis not present

## 2020-01-20 DIAGNOSIS — Z09 Encounter for follow-up examination after completed treatment for conditions other than malignant neoplasm: Secondary | ICD-10-CM | POA: Diagnosis not present

## 2020-01-27 DIAGNOSIS — I10 Essential (primary) hypertension: Secondary | ICD-10-CM | POA: Diagnosis not present

## 2020-01-27 DIAGNOSIS — T451X5A Adverse effect of antineoplastic and immunosuppressive drugs, initial encounter: Secondary | ICD-10-CM | POA: Diagnosis not present

## 2020-01-27 DIAGNOSIS — D701 Agranulocytosis secondary to cancer chemotherapy: Secondary | ICD-10-CM | POA: Diagnosis not present

## 2020-01-27 DIAGNOSIS — Z79890 Hormone replacement therapy (postmenopausal): Secondary | ICD-10-CM | POA: Diagnosis not present

## 2020-01-27 DIAGNOSIS — D630 Anemia in neoplastic disease: Secondary | ICD-10-CM | POA: Diagnosis not present

## 2020-01-27 DIAGNOSIS — C20 Malignant neoplasm of rectum: Secondary | ICD-10-CM | POA: Diagnosis not present

## 2020-01-27 DIAGNOSIS — Z09 Encounter for follow-up examination after completed treatment for conditions other than malignant neoplasm: Secondary | ICD-10-CM | POA: Diagnosis not present

## 2020-01-27 DIAGNOSIS — E039 Hypothyroidism, unspecified: Secondary | ICD-10-CM | POA: Diagnosis not present

## 2020-01-27 DIAGNOSIS — R12 Heartburn: Secondary | ICD-10-CM | POA: Diagnosis not present

## 2020-01-27 DIAGNOSIS — K219 Gastro-esophageal reflux disease without esophagitis: Secondary | ICD-10-CM | POA: Diagnosis not present

## 2020-01-27 DIAGNOSIS — M79673 Pain in unspecified foot: Secondary | ICD-10-CM | POA: Diagnosis not present

## 2020-01-27 DIAGNOSIS — Z5111 Encounter for antineoplastic chemotherapy: Secondary | ICD-10-CM | POA: Diagnosis not present

## 2020-01-27 DIAGNOSIS — Z79899 Other long term (current) drug therapy: Secondary | ICD-10-CM | POA: Diagnosis not present

## 2020-01-27 DIAGNOSIS — K529 Noninfective gastroenteritis and colitis, unspecified: Secondary | ICD-10-CM | POA: Diagnosis not present

## 2020-01-27 DIAGNOSIS — E785 Hyperlipidemia, unspecified: Secondary | ICD-10-CM | POA: Diagnosis not present

## 2020-01-27 DIAGNOSIS — Z6820 Body mass index (BMI) 20.0-20.9, adult: Secondary | ICD-10-CM | POA: Diagnosis not present

## 2020-01-27 DIAGNOSIS — G620 Drug-induced polyneuropathy: Secondary | ICD-10-CM | POA: Diagnosis not present

## 2020-01-30 DIAGNOSIS — L03031 Cellulitis of right toe: Secondary | ICD-10-CM | POA: Diagnosis not present

## 2020-01-30 DIAGNOSIS — M79674 Pain in right toe(s): Secondary | ICD-10-CM | POA: Diagnosis not present

## 2020-01-30 DIAGNOSIS — L600 Ingrowing nail: Secondary | ICD-10-CM | POA: Diagnosis not present

## 2020-01-30 DIAGNOSIS — M79671 Pain in right foot: Secondary | ICD-10-CM | POA: Diagnosis not present

## 2020-02-03 DIAGNOSIS — C20 Malignant neoplasm of rectum: Secondary | ICD-10-CM | POA: Diagnosis not present

## 2020-02-07 DIAGNOSIS — Z23 Encounter for immunization: Secondary | ICD-10-CM | POA: Diagnosis not present

## 2020-02-08 DIAGNOSIS — Z79899 Other long term (current) drug therapy: Secondary | ICD-10-CM | POA: Diagnosis not present

## 2020-02-08 DIAGNOSIS — C20 Malignant neoplasm of rectum: Secondary | ICD-10-CM | POA: Diagnosis not present

## 2020-02-08 DIAGNOSIS — Z5111 Encounter for antineoplastic chemotherapy: Secondary | ICD-10-CM | POA: Diagnosis not present

## 2020-02-24 DIAGNOSIS — Z09 Encounter for follow-up examination after completed treatment for conditions other than malignant neoplasm: Secondary | ICD-10-CM | POA: Diagnosis not present

## 2020-02-24 DIAGNOSIS — Z6820 Body mass index (BMI) 20.0-20.9, adult: Secondary | ICD-10-CM | POA: Diagnosis not present

## 2020-02-24 DIAGNOSIS — Z7189 Other specified counseling: Secondary | ICD-10-CM | POA: Diagnosis not present

## 2020-02-24 DIAGNOSIS — R970 Elevated carcinoembryonic antigen [CEA]: Secondary | ICD-10-CM | POA: Diagnosis not present

## 2020-02-24 DIAGNOSIS — R54 Age-related physical debility: Secondary | ICD-10-CM | POA: Diagnosis not present

## 2020-02-24 DIAGNOSIS — Z923 Personal history of irradiation: Secondary | ICD-10-CM | POA: Diagnosis not present

## 2020-02-24 DIAGNOSIS — C20 Malignant neoplasm of rectum: Secondary | ICD-10-CM | POA: Diagnosis not present

## 2020-02-28 DIAGNOSIS — C20 Malignant neoplasm of rectum: Secondary | ICD-10-CM | POA: Diagnosis not present

## 2020-02-28 DIAGNOSIS — Z7189 Other specified counseling: Secondary | ICD-10-CM | POA: Diagnosis not present

## 2020-02-28 DIAGNOSIS — Z6820 Body mass index (BMI) 20.0-20.9, adult: Secondary | ICD-10-CM | POA: Diagnosis not present

## 2020-02-28 DIAGNOSIS — R970 Elevated carcinoembryonic antigen [CEA]: Secondary | ICD-10-CM | POA: Diagnosis not present

## 2020-02-28 DIAGNOSIS — Z09 Encounter for follow-up examination after completed treatment for conditions other than malignant neoplasm: Secondary | ICD-10-CM | POA: Diagnosis not present

## 2020-02-28 DIAGNOSIS — R54 Age-related physical debility: Secondary | ICD-10-CM | POA: Diagnosis not present

## 2020-03-07 DIAGNOSIS — R911 Solitary pulmonary nodule: Secondary | ICD-10-CM | POA: Diagnosis not present

## 2020-03-07 DIAGNOSIS — C20 Malignant neoplasm of rectum: Secondary | ICD-10-CM | POA: Diagnosis not present

## 2020-03-07 DIAGNOSIS — R918 Other nonspecific abnormal finding of lung field: Secondary | ICD-10-CM | POA: Diagnosis not present

## 2020-03-07 DIAGNOSIS — I700 Atherosclerosis of aorta: Secondary | ICD-10-CM | POA: Diagnosis not present

## 2020-03-07 DIAGNOSIS — K6289 Other specified diseases of anus and rectum: Secondary | ICD-10-CM | POA: Diagnosis not present

## 2020-03-07 DIAGNOSIS — K5730 Diverticulosis of large intestine without perforation or abscess without bleeding: Secondary | ICD-10-CM | POA: Diagnosis not present

## 2020-03-08 DIAGNOSIS — R54 Age-related physical debility: Secondary | ICD-10-CM | POA: Diagnosis not present

## 2020-03-08 DIAGNOSIS — Z7189 Other specified counseling: Secondary | ICD-10-CM | POA: Diagnosis not present

## 2020-03-08 DIAGNOSIS — C7802 Secondary malignant neoplasm of left lung: Secondary | ICD-10-CM | POA: Diagnosis not present

## 2020-03-08 DIAGNOSIS — Z09 Encounter for follow-up examination after completed treatment for conditions other than malignant neoplasm: Secondary | ICD-10-CM | POA: Diagnosis not present

## 2020-03-08 DIAGNOSIS — C7801 Secondary malignant neoplasm of right lung: Secondary | ICD-10-CM | POA: Diagnosis not present

## 2020-03-08 DIAGNOSIS — Z6820 Body mass index (BMI) 20.0-20.9, adult: Secondary | ICD-10-CM | POA: Diagnosis not present

## 2020-03-08 DIAGNOSIS — C20 Malignant neoplasm of rectum: Secondary | ICD-10-CM | POA: Diagnosis not present

## 2020-03-08 DIAGNOSIS — R970 Elevated carcinoembryonic antigen [CEA]: Secondary | ICD-10-CM | POA: Diagnosis not present

## 2020-03-15 DIAGNOSIS — C20 Malignant neoplasm of rectum: Secondary | ICD-10-CM | POA: Diagnosis not present

## 2020-03-15 DIAGNOSIS — C7801 Secondary malignant neoplasm of right lung: Secondary | ICD-10-CM | POA: Diagnosis not present

## 2020-03-15 DIAGNOSIS — Z23 Encounter for immunization: Secondary | ICD-10-CM | POA: Diagnosis not present

## 2020-03-15 DIAGNOSIS — C7802 Secondary malignant neoplasm of left lung: Secondary | ICD-10-CM | POA: Diagnosis not present

## 2020-03-15 DIAGNOSIS — Z5111 Encounter for antineoplastic chemotherapy: Secondary | ICD-10-CM | POA: Diagnosis not present

## 2020-03-22 DIAGNOSIS — I10 Essential (primary) hypertension: Secondary | ICD-10-CM | POA: Diagnosis not present

## 2020-03-22 DIAGNOSIS — C20 Malignant neoplasm of rectum: Secondary | ICD-10-CM | POA: Diagnosis not present

## 2020-03-22 DIAGNOSIS — C7802 Secondary malignant neoplasm of left lung: Secondary | ICD-10-CM | POA: Diagnosis not present

## 2020-03-22 DIAGNOSIS — R54 Age-related physical debility: Secondary | ICD-10-CM | POA: Diagnosis not present

## 2020-03-22 DIAGNOSIS — Z09 Encounter for follow-up examination after completed treatment for conditions other than malignant neoplasm: Secondary | ICD-10-CM | POA: Diagnosis not present

## 2020-03-22 DIAGNOSIS — Z6821 Body mass index (BMI) 21.0-21.9, adult: Secondary | ICD-10-CM | POA: Diagnosis not present

## 2020-03-22 DIAGNOSIS — Z79899 Other long term (current) drug therapy: Secondary | ICD-10-CM | POA: Diagnosis not present

## 2020-03-22 DIAGNOSIS — E039 Hypothyroidism, unspecified: Secondary | ICD-10-CM | POA: Diagnosis not present

## 2020-03-22 DIAGNOSIS — E785 Hyperlipidemia, unspecified: Secondary | ICD-10-CM | POA: Diagnosis not present

## 2020-03-22 DIAGNOSIS — Z9049 Acquired absence of other specified parts of digestive tract: Secondary | ICD-10-CM | POA: Diagnosis not present

## 2020-03-22 DIAGNOSIS — C7801 Secondary malignant neoplasm of right lung: Secondary | ICD-10-CM | POA: Diagnosis not present

## 2020-03-26 DIAGNOSIS — I700 Atherosclerosis of aorta: Secondary | ICD-10-CM | POA: Diagnosis not present

## 2020-03-26 DIAGNOSIS — Z6821 Body mass index (BMI) 21.0-21.9, adult: Secondary | ICD-10-CM | POA: Diagnosis not present

## 2020-03-26 DIAGNOSIS — M1A00X0 Idiopathic chronic gout, unspecified site, without tophus (tophi): Secondary | ICD-10-CM | POA: Diagnosis not present

## 2020-03-26 DIAGNOSIS — I10 Essential (primary) hypertension: Secondary | ICD-10-CM | POA: Diagnosis not present

## 2020-03-26 DIAGNOSIS — R4582 Worries: Secondary | ICD-10-CM | POA: Diagnosis not present

## 2020-03-26 DIAGNOSIS — E782 Mixed hyperlipidemia: Secondary | ICD-10-CM | POA: Diagnosis not present

## 2020-03-26 DIAGNOSIS — C189 Malignant neoplasm of colon, unspecified: Secondary | ICD-10-CM | POA: Diagnosis not present

## 2020-03-26 DIAGNOSIS — C20 Malignant neoplasm of rectum: Secondary | ICD-10-CM | POA: Diagnosis not present

## 2020-03-28 DIAGNOSIS — C20 Malignant neoplasm of rectum: Secondary | ICD-10-CM | POA: Diagnosis not present

## 2020-03-28 DIAGNOSIS — Z09 Encounter for follow-up examination after completed treatment for conditions other than malignant neoplasm: Secondary | ICD-10-CM | POA: Diagnosis not present

## 2020-03-29 DIAGNOSIS — C20 Malignant neoplasm of rectum: Secondary | ICD-10-CM | POA: Diagnosis not present

## 2020-03-29 DIAGNOSIS — Z5111 Encounter for antineoplastic chemotherapy: Secondary | ICD-10-CM | POA: Diagnosis not present

## 2020-04-02 DIAGNOSIS — B351 Tinea unguium: Secondary | ICD-10-CM | POA: Diagnosis not present

## 2020-04-02 DIAGNOSIS — M79675 Pain in left toe(s): Secondary | ICD-10-CM | POA: Diagnosis not present

## 2020-04-02 DIAGNOSIS — M79671 Pain in right foot: Secondary | ICD-10-CM | POA: Diagnosis not present

## 2020-04-02 DIAGNOSIS — M79674 Pain in right toe(s): Secondary | ICD-10-CM | POA: Diagnosis not present

## 2020-04-02 DIAGNOSIS — M79672 Pain in left foot: Secondary | ICD-10-CM | POA: Diagnosis not present

## 2020-04-18 DIAGNOSIS — Z923 Personal history of irradiation: Secondary | ICD-10-CM | POA: Diagnosis not present

## 2020-04-18 DIAGNOSIS — R110 Nausea: Secondary | ICD-10-CM | POA: Diagnosis not present

## 2020-04-18 DIAGNOSIS — Z09 Encounter for follow-up examination after completed treatment for conditions other than malignant neoplasm: Secondary | ICD-10-CM | POA: Diagnosis not present

## 2020-04-18 DIAGNOSIS — R54 Age-related physical debility: Secondary | ICD-10-CM | POA: Diagnosis not present

## 2020-04-18 DIAGNOSIS — R970 Elevated carcinoembryonic antigen [CEA]: Secondary | ICD-10-CM | POA: Diagnosis not present

## 2020-04-18 DIAGNOSIS — T451X5A Adverse effect of antineoplastic and immunosuppressive drugs, initial encounter: Secondary | ICD-10-CM | POA: Diagnosis not present

## 2020-04-18 DIAGNOSIS — Z66 Do not resuscitate: Secondary | ICD-10-CM | POA: Diagnosis not present

## 2020-04-18 DIAGNOSIS — Z7189 Other specified counseling: Secondary | ICD-10-CM | POA: Diagnosis not present

## 2020-04-18 DIAGNOSIS — Z6821 Body mass index (BMI) 21.0-21.9, adult: Secondary | ICD-10-CM | POA: Diagnosis not present

## 2020-04-18 DIAGNOSIS — C20 Malignant neoplasm of rectum: Secondary | ICD-10-CM | POA: Diagnosis not present

## 2020-04-24 DIAGNOSIS — Z09 Encounter for follow-up examination after completed treatment for conditions other than malignant neoplasm: Secondary | ICD-10-CM | POA: Diagnosis not present

## 2020-04-24 DIAGNOSIS — C20 Malignant neoplasm of rectum: Secondary | ICD-10-CM | POA: Diagnosis not present

## 2020-04-26 DIAGNOSIS — Z5111 Encounter for antineoplastic chemotherapy: Secondary | ICD-10-CM | POA: Diagnosis not present

## 2020-04-26 DIAGNOSIS — C20 Malignant neoplasm of rectum: Secondary | ICD-10-CM | POA: Diagnosis not present

## 2020-05-02 DIAGNOSIS — Z09 Encounter for follow-up examination after completed treatment for conditions other than malignant neoplasm: Secondary | ICD-10-CM | POA: Diagnosis not present

## 2020-05-02 DIAGNOSIS — C20 Malignant neoplasm of rectum: Secondary | ICD-10-CM | POA: Diagnosis not present

## 2020-05-02 DIAGNOSIS — Z6820 Body mass index (BMI) 20.0-20.9, adult: Secondary | ICD-10-CM | POA: Diagnosis not present

## 2020-05-03 DIAGNOSIS — Z5111 Encounter for antineoplastic chemotherapy: Secondary | ICD-10-CM | POA: Diagnosis not present

## 2020-05-03 DIAGNOSIS — C20 Malignant neoplasm of rectum: Secondary | ICD-10-CM | POA: Diagnosis not present

## 2020-05-16 DIAGNOSIS — Z7189 Other specified counseling: Secondary | ICD-10-CM | POA: Diagnosis not present

## 2020-05-16 DIAGNOSIS — G620 Drug-induced polyneuropathy: Secondary | ICD-10-CM | POA: Diagnosis not present

## 2020-05-16 DIAGNOSIS — D701 Agranulocytosis secondary to cancer chemotherapy: Secondary | ICD-10-CM | POA: Diagnosis not present

## 2020-05-16 DIAGNOSIS — Z66 Do not resuscitate: Secondary | ICD-10-CM | POA: Diagnosis not present

## 2020-05-16 DIAGNOSIS — T451X5A Adverse effect of antineoplastic and immunosuppressive drugs, initial encounter: Secondary | ICD-10-CM | POA: Diagnosis not present

## 2020-05-16 DIAGNOSIS — R54 Age-related physical debility: Secondary | ICD-10-CM | POA: Diagnosis not present

## 2020-05-16 DIAGNOSIS — C20 Malignant neoplasm of rectum: Secondary | ICD-10-CM | POA: Diagnosis not present

## 2020-05-16 DIAGNOSIS — Z09 Encounter for follow-up examination after completed treatment for conditions other than malignant neoplasm: Secondary | ICD-10-CM | POA: Diagnosis not present

## 2020-05-16 DIAGNOSIS — Z6820 Body mass index (BMI) 20.0-20.9, adult: Secondary | ICD-10-CM | POA: Diagnosis not present

## 2020-05-25 DIAGNOSIS — C20 Malignant neoplasm of rectum: Secondary | ICD-10-CM | POA: Diagnosis not present

## 2020-05-25 DIAGNOSIS — Z66 Do not resuscitate: Secondary | ICD-10-CM | POA: Diagnosis not present

## 2020-05-25 DIAGNOSIS — Z6821 Body mass index (BMI) 21.0-21.9, adult: Secondary | ICD-10-CM | POA: Diagnosis not present

## 2020-05-25 DIAGNOSIS — T451X5A Adverse effect of antineoplastic and immunosuppressive drugs, initial encounter: Secondary | ICD-10-CM | POA: Diagnosis not present

## 2020-05-25 DIAGNOSIS — D701 Agranulocytosis secondary to cancer chemotherapy: Secondary | ICD-10-CM | POA: Diagnosis not present

## 2020-05-25 DIAGNOSIS — Z09 Encounter for follow-up examination after completed treatment for conditions other than malignant neoplasm: Secondary | ICD-10-CM | POA: Diagnosis not present

## 2020-05-25 DIAGNOSIS — Z7189 Other specified counseling: Secondary | ICD-10-CM | POA: Diagnosis not present

## 2020-05-25 DIAGNOSIS — Z923 Personal history of irradiation: Secondary | ICD-10-CM | POA: Diagnosis not present

## 2020-05-25 DIAGNOSIS — R54 Age-related physical debility: Secondary | ICD-10-CM | POA: Diagnosis not present

## 2020-06-07 DIAGNOSIS — Z20828 Contact with and (suspected) exposure to other viral communicable diseases: Secondary | ICD-10-CM | POA: Diagnosis not present

## 2020-06-07 DIAGNOSIS — R059 Cough, unspecified: Secondary | ICD-10-CM | POA: Diagnosis not present

## 2020-06-08 DIAGNOSIS — E7800 Pure hypercholesterolemia, unspecified: Secondary | ICD-10-CM | POA: Diagnosis not present

## 2020-06-08 DIAGNOSIS — C20 Malignant neoplasm of rectum: Secondary | ICD-10-CM | POA: Diagnosis not present

## 2020-06-08 DIAGNOSIS — R54 Age-related physical debility: Secondary | ICD-10-CM | POA: Diagnosis not present

## 2020-06-08 DIAGNOSIS — U071 COVID-19: Secondary | ICD-10-CM | POA: Diagnosis not present

## 2020-06-08 DIAGNOSIS — M1990 Unspecified osteoarthritis, unspecified site: Secondary | ICD-10-CM | POA: Diagnosis not present

## 2020-06-08 DIAGNOSIS — Z23 Encounter for immunization: Secondary | ICD-10-CM | POA: Diagnosis not present

## 2020-06-19 DIAGNOSIS — K529 Noninfective gastroenteritis and colitis, unspecified: Secondary | ICD-10-CM | POA: Diagnosis not present

## 2020-06-19 DIAGNOSIS — R970 Elevated carcinoembryonic antigen [CEA]: Secondary | ICD-10-CM | POA: Diagnosis not present

## 2020-06-19 DIAGNOSIS — C20 Malignant neoplasm of rectum: Secondary | ICD-10-CM | POA: Diagnosis not present

## 2020-06-19 DIAGNOSIS — Z09 Encounter for follow-up examination after completed treatment for conditions other than malignant neoplasm: Secondary | ICD-10-CM | POA: Diagnosis not present

## 2020-06-19 DIAGNOSIS — T451X5A Adverse effect of antineoplastic and immunosuppressive drugs, initial encounter: Secondary | ICD-10-CM | POA: Diagnosis not present

## 2020-06-19 DIAGNOSIS — D649 Anemia, unspecified: Secondary | ICD-10-CM | POA: Diagnosis not present

## 2020-06-19 DIAGNOSIS — Z8616 Personal history of COVID-19: Secondary | ICD-10-CM | POA: Diagnosis not present

## 2020-06-19 DIAGNOSIS — G620 Drug-induced polyneuropathy: Secondary | ICD-10-CM | POA: Diagnosis not present

## 2020-06-26 DIAGNOSIS — Z5111 Encounter for antineoplastic chemotherapy: Secondary | ICD-10-CM | POA: Diagnosis not present

## 2020-06-26 DIAGNOSIS — C20 Malignant neoplasm of rectum: Secondary | ICD-10-CM | POA: Diagnosis not present

## 2020-07-02 DIAGNOSIS — Z923 Personal history of irradiation: Secondary | ICD-10-CM | POA: Diagnosis not present

## 2020-07-02 DIAGNOSIS — Z7189 Other specified counseling: Secondary | ICD-10-CM | POA: Diagnosis not present

## 2020-07-02 DIAGNOSIS — Z66 Do not resuscitate: Secondary | ICD-10-CM | POA: Diagnosis not present

## 2020-07-02 DIAGNOSIS — D701 Agranulocytosis secondary to cancer chemotherapy: Secondary | ICD-10-CM | POA: Diagnosis not present

## 2020-07-02 DIAGNOSIS — Z09 Encounter for follow-up examination after completed treatment for conditions other than malignant neoplasm: Secondary | ICD-10-CM | POA: Diagnosis not present

## 2020-07-02 DIAGNOSIS — C20 Malignant neoplasm of rectum: Secondary | ICD-10-CM | POA: Diagnosis not present

## 2020-07-02 DIAGNOSIS — Z6820 Body mass index (BMI) 20.0-20.9, adult: Secondary | ICD-10-CM | POA: Diagnosis not present

## 2020-07-02 DIAGNOSIS — R970 Elevated carcinoembryonic antigen [CEA]: Secondary | ICD-10-CM | POA: Diagnosis not present

## 2020-07-02 DIAGNOSIS — R54 Age-related physical debility: Secondary | ICD-10-CM | POA: Diagnosis not present

## 2020-07-02 DIAGNOSIS — T451X5A Adverse effect of antineoplastic and immunosuppressive drugs, initial encounter: Secondary | ICD-10-CM | POA: Diagnosis not present

## 2020-07-02 DIAGNOSIS — C7801 Secondary malignant neoplasm of right lung: Secondary | ICD-10-CM | POA: Diagnosis not present

## 2020-07-02 DIAGNOSIS — C7802 Secondary malignant neoplasm of left lung: Secondary | ICD-10-CM | POA: Diagnosis not present

## 2020-07-03 DIAGNOSIS — C20 Malignant neoplasm of rectum: Secondary | ICD-10-CM | POA: Diagnosis not present

## 2020-07-03 DIAGNOSIS — Z5111 Encounter for antineoplastic chemotherapy: Secondary | ICD-10-CM | POA: Diagnosis not present

## 2020-07-16 DIAGNOSIS — T451X5A Adverse effect of antineoplastic and immunosuppressive drugs, initial encounter: Secondary | ICD-10-CM | POA: Diagnosis not present

## 2020-07-16 DIAGNOSIS — C20 Malignant neoplasm of rectum: Secondary | ICD-10-CM | POA: Diagnosis not present

## 2020-07-16 DIAGNOSIS — R110 Nausea: Secondary | ICD-10-CM | POA: Diagnosis not present

## 2020-07-16 DIAGNOSIS — C7801 Secondary malignant neoplasm of right lung: Secondary | ICD-10-CM | POA: Diagnosis not present

## 2020-07-16 DIAGNOSIS — G620 Drug-induced polyneuropathy: Secondary | ICD-10-CM | POA: Diagnosis not present

## 2020-07-16 DIAGNOSIS — Z66 Do not resuscitate: Secondary | ICD-10-CM | POA: Diagnosis not present

## 2020-07-16 DIAGNOSIS — C7802 Secondary malignant neoplasm of left lung: Secondary | ICD-10-CM | POA: Diagnosis not present

## 2020-07-16 DIAGNOSIS — D701 Agranulocytosis secondary to cancer chemotherapy: Secondary | ICD-10-CM | POA: Diagnosis not present

## 2020-07-16 DIAGNOSIS — Z6821 Body mass index (BMI) 21.0-21.9, adult: Secondary | ICD-10-CM | POA: Diagnosis not present

## 2020-07-16 DIAGNOSIS — Z09 Encounter for follow-up examination after completed treatment for conditions other than malignant neoplasm: Secondary | ICD-10-CM | POA: Diagnosis not present

## 2020-07-23 DIAGNOSIS — Z09 Encounter for follow-up examination after completed treatment for conditions other than malignant neoplasm: Secondary | ICD-10-CM | POA: Diagnosis not present

## 2020-07-23 DIAGNOSIS — C20 Malignant neoplasm of rectum: Secondary | ICD-10-CM | POA: Diagnosis not present

## 2020-07-30 DIAGNOSIS — Z66 Do not resuscitate: Secondary | ICD-10-CM | POA: Diagnosis not present

## 2020-07-30 DIAGNOSIS — Z13220 Encounter for screening for lipoid disorders: Secondary | ICD-10-CM | POA: Diagnosis not present

## 2020-07-30 DIAGNOSIS — T451X5A Adverse effect of antineoplastic and immunosuppressive drugs, initial encounter: Secondary | ICD-10-CM | POA: Diagnosis not present

## 2020-07-30 DIAGNOSIS — I10 Essential (primary) hypertension: Secondary | ICD-10-CM | POA: Diagnosis not present

## 2020-07-30 DIAGNOSIS — R54 Age-related physical debility: Secondary | ICD-10-CM | POA: Diagnosis not present

## 2020-07-30 DIAGNOSIS — C7801 Secondary malignant neoplasm of right lung: Secondary | ICD-10-CM | POA: Diagnosis not present

## 2020-07-30 DIAGNOSIS — D701 Agranulocytosis secondary to cancer chemotherapy: Secondary | ICD-10-CM | POA: Diagnosis not present

## 2020-07-30 DIAGNOSIS — E039 Hypothyroidism, unspecified: Secondary | ICD-10-CM | POA: Diagnosis not present

## 2020-07-30 DIAGNOSIS — Z6821 Body mass index (BMI) 21.0-21.9, adult: Secondary | ICD-10-CM | POA: Diagnosis not present

## 2020-07-30 DIAGNOSIS — C189 Malignant neoplasm of colon, unspecified: Secondary | ICD-10-CM | POA: Diagnosis not present

## 2020-07-30 DIAGNOSIS — Z09 Encounter for follow-up examination after completed treatment for conditions other than malignant neoplasm: Secondary | ICD-10-CM | POA: Diagnosis not present

## 2020-07-30 DIAGNOSIS — C20 Malignant neoplasm of rectum: Secondary | ICD-10-CM | POA: Diagnosis not present

## 2020-07-30 DIAGNOSIS — Z923 Personal history of irradiation: Secondary | ICD-10-CM | POA: Diagnosis not present

## 2020-07-30 DIAGNOSIS — C7802 Secondary malignant neoplasm of left lung: Secondary | ICD-10-CM | POA: Diagnosis not present

## 2020-07-31 DIAGNOSIS — Z5111 Encounter for antineoplastic chemotherapy: Secondary | ICD-10-CM | POA: Diagnosis not present

## 2020-07-31 DIAGNOSIS — C20 Malignant neoplasm of rectum: Secondary | ICD-10-CM | POA: Diagnosis not present

## 2020-08-03 DIAGNOSIS — D701 Agranulocytosis secondary to cancer chemotherapy: Secondary | ICD-10-CM | POA: Diagnosis not present

## 2020-08-03 DIAGNOSIS — I700 Atherosclerosis of aorta: Secondary | ICD-10-CM | POA: Diagnosis not present

## 2020-08-03 DIAGNOSIS — R54 Age-related physical debility: Secondary | ICD-10-CM | POA: Diagnosis not present

## 2020-08-03 DIAGNOSIS — T451X5A Adverse effect of antineoplastic and immunosuppressive drugs, initial encounter: Secondary | ICD-10-CM | POA: Diagnosis not present

## 2020-08-03 DIAGNOSIS — M1A00X0 Idiopathic chronic gout, unspecified site, without tophus (tophi): Secondary | ICD-10-CM | POA: Diagnosis not present

## 2020-08-03 DIAGNOSIS — Z66 Do not resuscitate: Secondary | ICD-10-CM | POA: Diagnosis not present

## 2020-08-03 DIAGNOSIS — E039 Hypothyroidism, unspecified: Secondary | ICD-10-CM | POA: Diagnosis not present

## 2020-08-03 DIAGNOSIS — C20 Malignant neoplasm of rectum: Secondary | ICD-10-CM | POA: Diagnosis not present

## 2020-08-03 DIAGNOSIS — Z09 Encounter for follow-up examination after completed treatment for conditions other than malignant neoplasm: Secondary | ICD-10-CM | POA: Diagnosis not present

## 2020-08-03 DIAGNOSIS — R4582 Worries: Secondary | ICD-10-CM | POA: Diagnosis not present

## 2020-08-03 DIAGNOSIS — C189 Malignant neoplasm of colon, unspecified: Secondary | ICD-10-CM | POA: Diagnosis not present

## 2020-08-03 DIAGNOSIS — C7801 Secondary malignant neoplasm of right lung: Secondary | ICD-10-CM | POA: Diagnosis not present

## 2020-08-03 DIAGNOSIS — C7802 Secondary malignant neoplasm of left lung: Secondary | ICD-10-CM | POA: Diagnosis not present

## 2020-08-03 DIAGNOSIS — E7849 Other hyperlipidemia: Secondary | ICD-10-CM | POA: Diagnosis not present

## 2020-08-03 DIAGNOSIS — Z923 Personal history of irradiation: Secondary | ICD-10-CM | POA: Diagnosis not present

## 2020-08-03 DIAGNOSIS — I10 Essential (primary) hypertension: Secondary | ICD-10-CM | POA: Diagnosis not present

## 2020-08-07 DIAGNOSIS — C20 Malignant neoplasm of rectum: Secondary | ICD-10-CM | POA: Diagnosis not present

## 2020-08-20 DIAGNOSIS — C7802 Secondary malignant neoplasm of left lung: Secondary | ICD-10-CM | POA: Diagnosis not present

## 2020-08-20 DIAGNOSIS — Z923 Personal history of irradiation: Secondary | ICD-10-CM | POA: Diagnosis not present

## 2020-08-20 DIAGNOSIS — R970 Elevated carcinoembryonic antigen [CEA]: Secondary | ICD-10-CM | POA: Diagnosis not present

## 2020-08-20 DIAGNOSIS — C7801 Secondary malignant neoplasm of right lung: Secondary | ICD-10-CM | POA: Diagnosis not present

## 2020-08-20 DIAGNOSIS — D701 Agranulocytosis secondary to cancer chemotherapy: Secondary | ICD-10-CM | POA: Diagnosis not present

## 2020-08-20 DIAGNOSIS — R54 Age-related physical debility: Secondary | ICD-10-CM | POA: Diagnosis not present

## 2020-08-20 DIAGNOSIS — Z6821 Body mass index (BMI) 21.0-21.9, adult: Secondary | ICD-10-CM | POA: Diagnosis not present

## 2020-08-20 DIAGNOSIS — T451X5A Adverse effect of antineoplastic and immunosuppressive drugs, initial encounter: Secondary | ICD-10-CM | POA: Diagnosis not present

## 2020-08-20 DIAGNOSIS — C20 Malignant neoplasm of rectum: Secondary | ICD-10-CM | POA: Diagnosis not present

## 2020-08-20 DIAGNOSIS — Z7189 Other specified counseling: Secondary | ICD-10-CM | POA: Diagnosis not present

## 2020-08-20 DIAGNOSIS — Z09 Encounter for follow-up examination after completed treatment for conditions other than malignant neoplasm: Secondary | ICD-10-CM | POA: Diagnosis not present

## 2020-08-30 DIAGNOSIS — C20 Malignant neoplasm of rectum: Secondary | ICD-10-CM | POA: Diagnosis not present

## 2020-08-30 DIAGNOSIS — Z6821 Body mass index (BMI) 21.0-21.9, adult: Secondary | ICD-10-CM | POA: Diagnosis not present

## 2020-08-30 DIAGNOSIS — T451X5A Adverse effect of antineoplastic and immunosuppressive drugs, initial encounter: Secondary | ICD-10-CM | POA: Diagnosis not present

## 2020-08-30 DIAGNOSIS — Z7189 Other specified counseling: Secondary | ICD-10-CM | POA: Diagnosis not present

## 2020-08-30 DIAGNOSIS — R54 Age-related physical debility: Secondary | ICD-10-CM | POA: Diagnosis not present

## 2020-08-30 DIAGNOSIS — Z923 Personal history of irradiation: Secondary | ICD-10-CM | POA: Diagnosis not present

## 2020-08-30 DIAGNOSIS — C7802 Secondary malignant neoplasm of left lung: Secondary | ICD-10-CM | POA: Diagnosis not present

## 2020-08-30 DIAGNOSIS — R970 Elevated carcinoembryonic antigen [CEA]: Secondary | ICD-10-CM | POA: Diagnosis not present

## 2020-08-30 DIAGNOSIS — C7801 Secondary malignant neoplasm of right lung: Secondary | ICD-10-CM | POA: Diagnosis not present

## 2020-08-30 DIAGNOSIS — D701 Agranulocytosis secondary to cancer chemotherapy: Secondary | ICD-10-CM | POA: Diagnosis not present

## 2020-08-30 DIAGNOSIS — Z09 Encounter for follow-up examination after completed treatment for conditions other than malignant neoplasm: Secondary | ICD-10-CM | POA: Diagnosis not present

## 2020-09-04 DIAGNOSIS — Z5111 Encounter for antineoplastic chemotherapy: Secondary | ICD-10-CM | POA: Diagnosis not present

## 2020-09-04 DIAGNOSIS — C20 Malignant neoplasm of rectum: Secondary | ICD-10-CM | POA: Diagnosis not present

## 2020-09-24 DIAGNOSIS — D701 Agranulocytosis secondary to cancer chemotherapy: Secondary | ICD-10-CM | POA: Diagnosis not present

## 2020-09-24 DIAGNOSIS — C20 Malignant neoplasm of rectum: Secondary | ICD-10-CM | POA: Diagnosis not present

## 2020-09-24 DIAGNOSIS — C7802 Secondary malignant neoplasm of left lung: Secondary | ICD-10-CM | POA: Diagnosis not present

## 2020-09-24 DIAGNOSIS — Z6820 Body mass index (BMI) 20.0-20.9, adult: Secondary | ICD-10-CM | POA: Diagnosis not present

## 2020-09-24 DIAGNOSIS — T451X5A Adverse effect of antineoplastic and immunosuppressive drugs, initial encounter: Secondary | ICD-10-CM | POA: Diagnosis not present

## 2020-09-24 DIAGNOSIS — Z09 Encounter for follow-up examination after completed treatment for conditions other than malignant neoplasm: Secondary | ICD-10-CM | POA: Diagnosis not present

## 2020-09-24 DIAGNOSIS — Z923 Personal history of irradiation: Secondary | ICD-10-CM | POA: Diagnosis not present

## 2020-09-24 DIAGNOSIS — R54 Age-related physical debility: Secondary | ICD-10-CM | POA: Diagnosis not present

## 2020-09-24 DIAGNOSIS — Z66 Do not resuscitate: Secondary | ICD-10-CM | POA: Diagnosis not present

## 2020-09-24 DIAGNOSIS — C7801 Secondary malignant neoplasm of right lung: Secondary | ICD-10-CM | POA: Diagnosis not present

## 2020-09-24 DIAGNOSIS — K591 Functional diarrhea: Secondary | ICD-10-CM | POA: Diagnosis not present

## 2020-09-24 DIAGNOSIS — Z7189 Other specified counseling: Secondary | ICD-10-CM | POA: Diagnosis not present

## 2020-09-25 DIAGNOSIS — C20 Malignant neoplasm of rectum: Secondary | ICD-10-CM | POA: Diagnosis not present

## 2020-09-25 DIAGNOSIS — Z5111 Encounter for antineoplastic chemotherapy: Secondary | ICD-10-CM | POA: Diagnosis not present

## 2020-10-08 DIAGNOSIS — Z09 Encounter for follow-up examination after completed treatment for conditions other than malignant neoplasm: Secondary | ICD-10-CM | POA: Diagnosis not present

## 2020-10-08 DIAGNOSIS — Z6820 Body mass index (BMI) 20.0-20.9, adult: Secondary | ICD-10-CM | POA: Diagnosis not present

## 2020-10-08 DIAGNOSIS — D649 Anemia, unspecified: Secondary | ICD-10-CM | POA: Diagnosis not present

## 2020-10-08 DIAGNOSIS — T451X5A Adverse effect of antineoplastic and immunosuppressive drugs, initial encounter: Secondary | ICD-10-CM | POA: Diagnosis not present

## 2020-10-08 DIAGNOSIS — C20 Malignant neoplasm of rectum: Secondary | ICD-10-CM | POA: Diagnosis not present

## 2020-10-08 DIAGNOSIS — K529 Noninfective gastroenteritis and colitis, unspecified: Secondary | ICD-10-CM | POA: Diagnosis not present

## 2020-10-09 DIAGNOSIS — C20 Malignant neoplasm of rectum: Secondary | ICD-10-CM | POA: Diagnosis not present

## 2020-10-09 DIAGNOSIS — Z5111 Encounter for antineoplastic chemotherapy: Secondary | ICD-10-CM | POA: Diagnosis not present

## 2020-10-22 DIAGNOSIS — C20 Malignant neoplasm of rectum: Secondary | ICD-10-CM | POA: Diagnosis not present

## 2020-10-22 DIAGNOSIS — Z6820 Body mass index (BMI) 20.0-20.9, adult: Secondary | ICD-10-CM | POA: Diagnosis not present

## 2020-10-23 DIAGNOSIS — C20 Malignant neoplasm of rectum: Secondary | ICD-10-CM | POA: Diagnosis not present

## 2020-10-23 DIAGNOSIS — Z5111 Encounter for antineoplastic chemotherapy: Secondary | ICD-10-CM | POA: Diagnosis not present

## 2020-11-13 DIAGNOSIS — Z6820 Body mass index (BMI) 20.0-20.9, adult: Secondary | ICD-10-CM | POA: Diagnosis not present

## 2020-11-13 DIAGNOSIS — C7802 Secondary malignant neoplasm of left lung: Secondary | ICD-10-CM | POA: Diagnosis not present

## 2020-11-13 DIAGNOSIS — C7801 Secondary malignant neoplasm of right lung: Secondary | ICD-10-CM | POA: Diagnosis not present

## 2020-11-13 DIAGNOSIS — Z09 Encounter for follow-up examination after completed treatment for conditions other than malignant neoplasm: Secondary | ICD-10-CM | POA: Diagnosis not present

## 2020-11-13 DIAGNOSIS — C20 Malignant neoplasm of rectum: Secondary | ICD-10-CM | POA: Diagnosis not present

## 2020-11-14 DIAGNOSIS — C20 Malignant neoplasm of rectum: Secondary | ICD-10-CM | POA: Diagnosis not present

## 2020-11-14 DIAGNOSIS — Z5111 Encounter for antineoplastic chemotherapy: Secondary | ICD-10-CM | POA: Diagnosis not present

## 2020-11-26 DIAGNOSIS — D519 Vitamin B12 deficiency anemia, unspecified: Secondary | ICD-10-CM | POA: Diagnosis not present

## 2020-11-26 DIAGNOSIS — C189 Malignant neoplasm of colon, unspecified: Secondary | ICD-10-CM | POA: Diagnosis not present

## 2020-11-26 DIAGNOSIS — E7849 Other hyperlipidemia: Secondary | ICD-10-CM | POA: Diagnosis not present

## 2020-11-26 DIAGNOSIS — I10 Essential (primary) hypertension: Secondary | ICD-10-CM | POA: Diagnosis not present

## 2020-11-26 DIAGNOSIS — E782 Mixed hyperlipidemia: Secondary | ICD-10-CM | POA: Diagnosis not present

## 2020-11-26 DIAGNOSIS — D649 Anemia, unspecified: Secondary | ICD-10-CM | POA: Diagnosis not present

## 2020-11-26 DIAGNOSIS — D529 Folate deficiency anemia, unspecified: Secondary | ICD-10-CM | POA: Diagnosis not present

## 2020-11-27 DIAGNOSIS — T451X5A Adverse effect of antineoplastic and immunosuppressive drugs, initial encounter: Secondary | ICD-10-CM | POA: Diagnosis not present

## 2020-11-27 DIAGNOSIS — C20 Malignant neoplasm of rectum: Secondary | ICD-10-CM | POA: Diagnosis not present

## 2020-11-27 DIAGNOSIS — Z6820 Body mass index (BMI) 20.0-20.9, adult: Secondary | ICD-10-CM | POA: Diagnosis not present

## 2020-11-27 DIAGNOSIS — K521 Toxic gastroenteritis and colitis: Secondary | ICD-10-CM | POA: Diagnosis not present

## 2020-11-29 DIAGNOSIS — D492 Neoplasm of unspecified behavior of bone, soft tissue, and skin: Secondary | ICD-10-CM | POA: Diagnosis not present

## 2020-11-29 DIAGNOSIS — T451X5A Adverse effect of antineoplastic and immunosuppressive drugs, initial encounter: Secondary | ICD-10-CM | POA: Diagnosis not present

## 2020-11-29 DIAGNOSIS — C20 Malignant neoplasm of rectum: Secondary | ICD-10-CM | POA: Diagnosis not present

## 2020-11-29 DIAGNOSIS — Z8719 Personal history of other diseases of the digestive system: Secondary | ICD-10-CM | POA: Diagnosis not present

## 2020-11-29 DIAGNOSIS — R197 Diarrhea, unspecified: Secondary | ICD-10-CM | POA: Diagnosis not present

## 2020-11-29 DIAGNOSIS — K521 Toxic gastroenteritis and colitis: Secondary | ICD-10-CM | POA: Diagnosis not present

## 2020-12-05 DIAGNOSIS — Z5111 Encounter for antineoplastic chemotherapy: Secondary | ICD-10-CM | POA: Diagnosis not present

## 2020-12-05 DIAGNOSIS — C20 Malignant neoplasm of rectum: Secondary | ICD-10-CM | POA: Diagnosis not present

## 2020-12-14 DIAGNOSIS — C189 Malignant neoplasm of colon, unspecified: Secondary | ICD-10-CM | POA: Diagnosis not present

## 2020-12-14 DIAGNOSIS — R4582 Worries: Secondary | ICD-10-CM | POA: Diagnosis not present

## 2020-12-14 DIAGNOSIS — Z0001 Encounter for general adult medical examination with abnormal findings: Secondary | ICD-10-CM | POA: Diagnosis not present

## 2020-12-14 DIAGNOSIS — I700 Atherosclerosis of aorta: Secondary | ICD-10-CM | POA: Diagnosis not present

## 2020-12-14 DIAGNOSIS — C7800 Secondary malignant neoplasm of unspecified lung: Secondary | ICD-10-CM | POA: Diagnosis not present

## 2020-12-14 DIAGNOSIS — I10 Essential (primary) hypertension: Secondary | ICD-10-CM | POA: Diagnosis not present

## 2020-12-14 DIAGNOSIS — E7849 Other hyperlipidemia: Secondary | ICD-10-CM | POA: Diagnosis not present

## 2020-12-14 DIAGNOSIS — E039 Hypothyroidism, unspecified: Secondary | ICD-10-CM | POA: Diagnosis not present

## 2020-12-17 DIAGNOSIS — K521 Toxic gastroenteritis and colitis: Secondary | ICD-10-CM | POA: Diagnosis not present

## 2020-12-17 DIAGNOSIS — C20 Malignant neoplasm of rectum: Secondary | ICD-10-CM | POA: Diagnosis not present

## 2020-12-17 DIAGNOSIS — T451X5A Adverse effect of antineoplastic and immunosuppressive drugs, initial encounter: Secondary | ICD-10-CM | POA: Diagnosis not present

## 2020-12-17 DIAGNOSIS — Z6820 Body mass index (BMI) 20.0-20.9, adult: Secondary | ICD-10-CM | POA: Diagnosis not present

## 2020-12-19 DIAGNOSIS — C20 Malignant neoplasm of rectum: Secondary | ICD-10-CM | POA: Diagnosis not present

## 2020-12-19 DIAGNOSIS — Z6820 Body mass index (BMI) 20.0-20.9, adult: Secondary | ICD-10-CM | POA: Diagnosis not present

## 2020-12-19 DIAGNOSIS — Z5111 Encounter for antineoplastic chemotherapy: Secondary | ICD-10-CM | POA: Diagnosis not present

## 2020-12-19 DIAGNOSIS — K921 Melena: Secondary | ICD-10-CM | POA: Diagnosis not present

## 2020-12-31 DIAGNOSIS — D500 Iron deficiency anemia secondary to blood loss (chronic): Secondary | ICD-10-CM | POA: Diagnosis not present

## 2020-12-31 DIAGNOSIS — C20 Malignant neoplasm of rectum: Secondary | ICD-10-CM | POA: Diagnosis not present

## 2020-12-31 DIAGNOSIS — Z09 Encounter for follow-up examination after completed treatment for conditions other than malignant neoplasm: Secondary | ICD-10-CM | POA: Diagnosis not present

## 2020-12-31 DIAGNOSIS — C7802 Secondary malignant neoplasm of left lung: Secondary | ICD-10-CM | POA: Diagnosis not present

## 2020-12-31 DIAGNOSIS — Z6820 Body mass index (BMI) 20.0-20.9, adult: Secondary | ICD-10-CM | POA: Diagnosis not present

## 2020-12-31 DIAGNOSIS — C7801 Secondary malignant neoplasm of right lung: Secondary | ICD-10-CM | POA: Diagnosis not present

## 2021-01-02 DIAGNOSIS — C20 Malignant neoplasm of rectum: Secondary | ICD-10-CM | POA: Diagnosis not present

## 2021-01-02 DIAGNOSIS — Z5111 Encounter for antineoplastic chemotherapy: Secondary | ICD-10-CM | POA: Diagnosis not present

## 2021-01-11 DIAGNOSIS — I952 Hypotension due to drugs: Secondary | ICD-10-CM | POA: Diagnosis not present

## 2021-01-11 DIAGNOSIS — Z923 Personal history of irradiation: Secondary | ICD-10-CM | POA: Diagnosis not present

## 2021-01-11 DIAGNOSIS — C20 Malignant neoplasm of rectum: Secondary | ICD-10-CM | POA: Diagnosis not present

## 2021-01-11 DIAGNOSIS — D500 Iron deficiency anemia secondary to blood loss (chronic): Secondary | ICD-10-CM | POA: Diagnosis not present

## 2021-01-11 DIAGNOSIS — R970 Elevated carcinoembryonic antigen [CEA]: Secondary | ICD-10-CM | POA: Diagnosis not present

## 2021-01-11 DIAGNOSIS — K1231 Oral mucositis (ulcerative) due to antineoplastic therapy: Secondary | ICD-10-CM | POA: Diagnosis not present

## 2021-01-11 DIAGNOSIS — C7801 Secondary malignant neoplasm of right lung: Secondary | ICD-10-CM | POA: Diagnosis not present

## 2021-01-11 DIAGNOSIS — Z7189 Other specified counseling: Secondary | ICD-10-CM | POA: Diagnosis not present

## 2021-01-11 DIAGNOSIS — Z09 Encounter for follow-up examination after completed treatment for conditions other than malignant neoplasm: Secondary | ICD-10-CM | POA: Diagnosis not present

## 2021-01-11 DIAGNOSIS — R54 Age-related physical debility: Secondary | ICD-10-CM | POA: Diagnosis not present

## 2021-01-11 DIAGNOSIS — C7802 Secondary malignant neoplasm of left lung: Secondary | ICD-10-CM | POA: Diagnosis not present

## 2021-01-23 DIAGNOSIS — Z09 Encounter for follow-up examination after completed treatment for conditions other than malignant neoplasm: Secondary | ICD-10-CM | POA: Diagnosis not present

## 2021-01-23 DIAGNOSIS — C7801 Secondary malignant neoplasm of right lung: Secondary | ICD-10-CM | POA: Diagnosis not present

## 2021-01-23 DIAGNOSIS — C7802 Secondary malignant neoplasm of left lung: Secondary | ICD-10-CM | POA: Diagnosis not present

## 2021-01-24 DIAGNOSIS — C20 Malignant neoplasm of rectum: Secondary | ICD-10-CM | POA: Diagnosis not present

## 2021-02-05 DIAGNOSIS — C7802 Secondary malignant neoplasm of left lung: Secondary | ICD-10-CM | POA: Diagnosis not present

## 2021-02-05 DIAGNOSIS — C7801 Secondary malignant neoplasm of right lung: Secondary | ICD-10-CM | POA: Diagnosis not present

## 2021-02-05 DIAGNOSIS — Z09 Encounter for follow-up examination after completed treatment for conditions other than malignant neoplasm: Secondary | ICD-10-CM | POA: Diagnosis not present

## 2021-02-05 DIAGNOSIS — C20 Malignant neoplasm of rectum: Secondary | ICD-10-CM | POA: Diagnosis not present

## 2021-02-12 DIAGNOSIS — Z09 Encounter for follow-up examination after completed treatment for conditions other than malignant neoplasm: Secondary | ICD-10-CM | POA: Diagnosis not present

## 2021-02-12 DIAGNOSIS — C20 Malignant neoplasm of rectum: Secondary | ICD-10-CM | POA: Diagnosis not present

## 2021-02-12 DIAGNOSIS — C7801 Secondary malignant neoplasm of right lung: Secondary | ICD-10-CM | POA: Diagnosis not present

## 2021-02-12 DIAGNOSIS — C7802 Secondary malignant neoplasm of left lung: Secondary | ICD-10-CM | POA: Diagnosis not present

## 2021-02-14 DIAGNOSIS — Z5111 Encounter for antineoplastic chemotherapy: Secondary | ICD-10-CM | POA: Diagnosis not present

## 2021-02-14 DIAGNOSIS — C20 Malignant neoplasm of rectum: Secondary | ICD-10-CM | POA: Diagnosis not present

## 2021-02-27 DIAGNOSIS — C20 Malignant neoplasm of rectum: Secondary | ICD-10-CM | POA: Diagnosis not present

## 2021-02-27 DIAGNOSIS — Z09 Encounter for follow-up examination after completed treatment for conditions other than malignant neoplasm: Secondary | ICD-10-CM | POA: Diagnosis not present

## 2021-03-05 DIAGNOSIS — C20 Malignant neoplasm of rectum: Secondary | ICD-10-CM | POA: Diagnosis not present

## 2021-03-05 DIAGNOSIS — Z5111 Encounter for antineoplastic chemotherapy: Secondary | ICD-10-CM | POA: Diagnosis not present

## 2021-03-15 DIAGNOSIS — Z23 Encounter for immunization: Secondary | ICD-10-CM | POA: Diagnosis not present

## 2021-03-18 DIAGNOSIS — K521 Toxic gastroenteritis and colitis: Secondary | ICD-10-CM | POA: Diagnosis not present

## 2021-03-18 DIAGNOSIS — T451X5A Adverse effect of antineoplastic and immunosuppressive drugs, initial encounter: Secondary | ICD-10-CM | POA: Diagnosis not present

## 2021-03-18 DIAGNOSIS — Z6821 Body mass index (BMI) 21.0-21.9, adult: Secondary | ICD-10-CM | POA: Diagnosis not present

## 2021-03-18 DIAGNOSIS — C20 Malignant neoplasm of rectum: Secondary | ICD-10-CM | POA: Diagnosis not present

## 2021-03-19 DIAGNOSIS — C20 Malignant neoplasm of rectum: Secondary | ICD-10-CM | POA: Diagnosis not present

## 2021-03-19 DIAGNOSIS — Z5111 Encounter for antineoplastic chemotherapy: Secondary | ICD-10-CM | POA: Diagnosis not present

## 2021-03-29 DIAGNOSIS — Z5111 Encounter for antineoplastic chemotherapy: Secondary | ICD-10-CM | POA: Diagnosis not present

## 2021-03-29 DIAGNOSIS — Z6820 Body mass index (BMI) 20.0-20.9, adult: Secondary | ICD-10-CM | POA: Diagnosis not present

## 2021-03-29 DIAGNOSIS — C20 Malignant neoplasm of rectum: Secondary | ICD-10-CM | POA: Diagnosis not present

## 2021-03-29 DIAGNOSIS — C7802 Secondary malignant neoplasm of left lung: Secondary | ICD-10-CM | POA: Diagnosis not present

## 2021-03-29 DIAGNOSIS — E7800 Pure hypercholesterolemia, unspecified: Secondary | ICD-10-CM | POA: Diagnosis not present

## 2021-03-29 DIAGNOSIS — C7801 Secondary malignant neoplasm of right lung: Secondary | ICD-10-CM | POA: Diagnosis not present

## 2021-03-29 DIAGNOSIS — R5383 Other fatigue: Secondary | ICD-10-CM | POA: Diagnosis not present

## 2021-04-02 DIAGNOSIS — C20 Malignant neoplasm of rectum: Secondary | ICD-10-CM | POA: Diagnosis not present

## 2021-04-02 DIAGNOSIS — Z95828 Presence of other vascular implants and grafts: Secondary | ICD-10-CM | POA: Diagnosis not present

## 2021-04-02 DIAGNOSIS — Z5111 Encounter for antineoplastic chemotherapy: Secondary | ICD-10-CM | POA: Diagnosis not present

## 2021-04-05 DIAGNOSIS — E039 Hypothyroidism, unspecified: Secondary | ICD-10-CM | POA: Diagnosis not present

## 2021-04-05 DIAGNOSIS — E7849 Other hyperlipidemia: Secondary | ICD-10-CM | POA: Diagnosis not present

## 2021-04-05 DIAGNOSIS — I10 Essential (primary) hypertension: Secondary | ICD-10-CM | POA: Diagnosis not present

## 2021-04-05 DIAGNOSIS — E782 Mixed hyperlipidemia: Secondary | ICD-10-CM | POA: Diagnosis not present

## 2021-04-12 DIAGNOSIS — M1A00X0 Idiopathic chronic gout, unspecified site, without tophus (tophi): Secondary | ICD-10-CM | POA: Diagnosis not present

## 2021-04-12 DIAGNOSIS — C20 Malignant neoplasm of rectum: Secondary | ICD-10-CM | POA: Diagnosis not present

## 2021-04-12 DIAGNOSIS — E039 Hypothyroidism, unspecified: Secondary | ICD-10-CM | POA: Diagnosis not present

## 2021-04-12 DIAGNOSIS — E7849 Other hyperlipidemia: Secondary | ICD-10-CM | POA: Diagnosis not present

## 2021-04-12 DIAGNOSIS — C189 Malignant neoplasm of colon, unspecified: Secondary | ICD-10-CM | POA: Diagnosis not present

## 2021-04-12 DIAGNOSIS — R4582 Worries: Secondary | ICD-10-CM | POA: Diagnosis not present

## 2021-04-12 DIAGNOSIS — I10 Essential (primary) hypertension: Secondary | ICD-10-CM | POA: Diagnosis not present

## 2021-04-12 DIAGNOSIS — I700 Atherosclerosis of aorta: Secondary | ICD-10-CM | POA: Diagnosis not present

## 2021-04-15 DIAGNOSIS — D508 Other iron deficiency anemias: Secondary | ICD-10-CM | POA: Diagnosis not present

## 2021-04-15 DIAGNOSIS — R5383 Other fatigue: Secondary | ICD-10-CM | POA: Diagnosis not present

## 2021-04-15 DIAGNOSIS — D701 Agranulocytosis secondary to cancer chemotherapy: Secondary | ICD-10-CM | POA: Diagnosis not present

## 2021-04-15 DIAGNOSIS — C20 Malignant neoplasm of rectum: Secondary | ICD-10-CM | POA: Diagnosis not present

## 2021-04-15 DIAGNOSIS — Z09 Encounter for follow-up examination after completed treatment for conditions other than malignant neoplasm: Secondary | ICD-10-CM | POA: Diagnosis not present

## 2021-04-15 DIAGNOSIS — Z1329 Encounter for screening for other suspected endocrine disorder: Secondary | ICD-10-CM | POA: Diagnosis not present

## 2021-04-15 DIAGNOSIS — C7802 Secondary malignant neoplasm of left lung: Secondary | ICD-10-CM | POA: Diagnosis not present

## 2021-04-15 DIAGNOSIS — T451X5A Adverse effect of antineoplastic and immunosuppressive drugs, initial encounter: Secondary | ICD-10-CM | POA: Diagnosis not present

## 2021-04-15 DIAGNOSIS — Z6820 Body mass index (BMI) 20.0-20.9, adult: Secondary | ICD-10-CM | POA: Diagnosis not present

## 2021-04-15 DIAGNOSIS — C7801 Secondary malignant neoplasm of right lung: Secondary | ICD-10-CM | POA: Diagnosis not present

## 2021-04-22 DIAGNOSIS — C20 Malignant neoplasm of rectum: Secondary | ICD-10-CM | POA: Diagnosis not present

## 2021-04-22 DIAGNOSIS — D492 Neoplasm of unspecified behavior of bone, soft tissue, and skin: Secondary | ICD-10-CM | POA: Diagnosis not present

## 2021-04-29 DIAGNOSIS — C20 Malignant neoplasm of rectum: Secondary | ICD-10-CM | POA: Diagnosis not present

## 2021-04-29 DIAGNOSIS — C7802 Secondary malignant neoplasm of left lung: Secondary | ICD-10-CM | POA: Diagnosis not present

## 2021-04-29 DIAGNOSIS — Z5111 Encounter for antineoplastic chemotherapy: Secondary | ICD-10-CM | POA: Diagnosis not present

## 2021-04-29 DIAGNOSIS — C7801 Secondary malignant neoplasm of right lung: Secondary | ICD-10-CM | POA: Diagnosis not present

## 2021-04-29 DIAGNOSIS — Z09 Encounter for follow-up examination after completed treatment for conditions other than malignant neoplasm: Secondary | ICD-10-CM | POA: Diagnosis not present

## 2021-05-02 DIAGNOSIS — C20 Malignant neoplasm of rectum: Secondary | ICD-10-CM | POA: Diagnosis not present

## 2021-05-02 DIAGNOSIS — K7689 Other specified diseases of liver: Secondary | ICD-10-CM | POA: Diagnosis not present

## 2021-05-02 DIAGNOSIS — I700 Atherosclerosis of aorta: Secondary | ICD-10-CM | POA: Diagnosis not present

## 2021-05-02 DIAGNOSIS — I2510 Atherosclerotic heart disease of native coronary artery without angina pectoris: Secondary | ICD-10-CM | POA: Diagnosis not present

## 2021-05-02 DIAGNOSIS — K5730 Diverticulosis of large intestine without perforation or abscess without bleeding: Secondary | ICD-10-CM | POA: Diagnosis not present

## 2021-05-02 DIAGNOSIS — K6289 Other specified diseases of anus and rectum: Secondary | ICD-10-CM | POA: Diagnosis not present

## 2021-05-02 DIAGNOSIS — I517 Cardiomegaly: Secondary | ICD-10-CM | POA: Diagnosis not present

## 2021-05-02 DIAGNOSIS — M419 Scoliosis, unspecified: Secondary | ICD-10-CM | POA: Diagnosis not present

## 2021-05-17 DIAGNOSIS — C20 Malignant neoplasm of rectum: Secondary | ICD-10-CM | POA: Diagnosis not present

## 2021-05-17 DIAGNOSIS — Z6820 Body mass index (BMI) 20.0-20.9, adult: Secondary | ICD-10-CM | POA: Diagnosis not present

## 2021-06-04 DIAGNOSIS — C7802 Secondary malignant neoplasm of left lung: Secondary | ICD-10-CM | POA: Diagnosis not present

## 2021-06-04 DIAGNOSIS — C20 Malignant neoplasm of rectum: Secondary | ICD-10-CM | POA: Diagnosis not present

## 2021-06-04 DIAGNOSIS — C7801 Secondary malignant neoplasm of right lung: Secondary | ICD-10-CM | POA: Diagnosis not present

## 2021-06-11 DIAGNOSIS — K625 Hemorrhage of anus and rectum: Secondary | ICD-10-CM | POA: Diagnosis not present

## 2021-06-11 DIAGNOSIS — J984 Other disorders of lung: Secondary | ICD-10-CM | POA: Diagnosis not present

## 2021-06-11 DIAGNOSIS — C787 Secondary malignant neoplasm of liver and intrahepatic bile duct: Secondary | ICD-10-CM | POA: Diagnosis not present

## 2021-06-11 DIAGNOSIS — I10 Essential (primary) hypertension: Secondary | ICD-10-CM | POA: Diagnosis not present

## 2021-06-11 DIAGNOSIS — R0981 Nasal congestion: Secondary | ICD-10-CM | POA: Diagnosis not present

## 2021-06-11 DIAGNOSIS — T451X5A Adverse effect of antineoplastic and immunosuppressive drugs, initial encounter: Secondary | ICD-10-CM | POA: Diagnosis not present

## 2021-06-11 DIAGNOSIS — C20 Malignant neoplasm of rectum: Secondary | ICD-10-CM | POA: Diagnosis not present

## 2021-06-11 DIAGNOSIS — Z6820 Body mass index (BMI) 20.0-20.9, adult: Secondary | ICD-10-CM | POA: Diagnosis not present

## 2021-06-11 DIAGNOSIS — Z515 Encounter for palliative care: Secondary | ICD-10-CM | POA: Diagnosis not present

## 2021-06-11 DIAGNOSIS — Z9221 Personal history of antineoplastic chemotherapy: Secondary | ICD-10-CM | POA: Diagnosis not present

## 2021-06-11 DIAGNOSIS — D709 Neutropenia, unspecified: Secondary | ICD-10-CM | POA: Diagnosis not present

## 2021-06-11 DIAGNOSIS — K529 Noninfective gastroenteritis and colitis, unspecified: Secondary | ICD-10-CM | POA: Diagnosis not present

## 2021-06-11 DIAGNOSIS — K769 Liver disease, unspecified: Secondary | ICD-10-CM | POA: Diagnosis not present

## 2021-06-23 DIAGNOSIS — C20 Malignant neoplasm of rectum: Secondary | ICD-10-CM | POA: Diagnosis not present

## 2021-07-09 DIAGNOSIS — E7800 Pure hypercholesterolemia, unspecified: Secondary | ICD-10-CM | POA: Diagnosis not present

## 2021-07-09 DIAGNOSIS — C7802 Secondary malignant neoplasm of left lung: Secondary | ICD-10-CM | POA: Diagnosis not present

## 2021-07-09 DIAGNOSIS — R970 Elevated carcinoembryonic antigen [CEA]: Secondary | ICD-10-CM | POA: Diagnosis not present

## 2021-07-09 DIAGNOSIS — Z09 Encounter for follow-up examination after completed treatment for conditions other than malignant neoplasm: Secondary | ICD-10-CM | POA: Diagnosis not present

## 2021-07-09 DIAGNOSIS — C20 Malignant neoplasm of rectum: Secondary | ICD-10-CM | POA: Diagnosis not present

## 2021-07-09 DIAGNOSIS — E875 Hyperkalemia: Secondary | ICD-10-CM | POA: Diagnosis not present

## 2021-07-09 DIAGNOSIS — C7801 Secondary malignant neoplasm of right lung: Secondary | ICD-10-CM | POA: Diagnosis not present

## 2021-07-09 DIAGNOSIS — R0609 Other forms of dyspnea: Secondary | ICD-10-CM | POA: Diagnosis not present

## 2021-07-16 DIAGNOSIS — C20 Malignant neoplasm of rectum: Secondary | ICD-10-CM | POA: Diagnosis not present

## 2021-07-17 DIAGNOSIS — R5383 Other fatigue: Secondary | ICD-10-CM | POA: Diagnosis not present

## 2021-07-17 DIAGNOSIS — I10 Essential (primary) hypertension: Secondary | ICD-10-CM | POA: Diagnosis not present

## 2021-07-22 DIAGNOSIS — E039 Hypothyroidism, unspecified: Secondary | ICD-10-CM | POA: Diagnosis not present

## 2021-07-22 DIAGNOSIS — I700 Atherosclerosis of aorta: Secondary | ICD-10-CM | POA: Diagnosis not present

## 2021-07-22 DIAGNOSIS — C20 Malignant neoplasm of rectum: Secondary | ICD-10-CM | POA: Diagnosis not present

## 2021-07-22 DIAGNOSIS — C189 Malignant neoplasm of colon, unspecified: Secondary | ICD-10-CM | POA: Diagnosis not present

## 2021-07-22 DIAGNOSIS — E7849 Other hyperlipidemia: Secondary | ICD-10-CM | POA: Diagnosis not present

## 2021-07-22 DIAGNOSIS — M1A00X0 Idiopathic chronic gout, unspecified site, without tophus (tophi): Secondary | ICD-10-CM | POA: Diagnosis not present

## 2021-07-22 DIAGNOSIS — I10 Essential (primary) hypertension: Secondary | ICD-10-CM | POA: Diagnosis not present

## 2021-07-22 DIAGNOSIS — C7800 Secondary malignant neoplasm of unspecified lung: Secondary | ICD-10-CM | POA: Diagnosis not present

## 2021-08-14 DIAGNOSIS — T451X5A Adverse effect of antineoplastic and immunosuppressive drugs, initial encounter: Secondary | ICD-10-CM | POA: Diagnosis not present

## 2021-08-14 DIAGNOSIS — R970 Elevated carcinoembryonic antigen [CEA]: Secondary | ICD-10-CM | POA: Diagnosis not present

## 2021-08-14 DIAGNOSIS — C7801 Secondary malignant neoplasm of right lung: Secondary | ICD-10-CM | POA: Diagnosis not present

## 2021-08-14 DIAGNOSIS — R0609 Other forms of dyspnea: Secondary | ICD-10-CM | POA: Diagnosis not present

## 2021-08-14 DIAGNOSIS — Z09 Encounter for follow-up examination after completed treatment for conditions other than malignant neoplasm: Secondary | ICD-10-CM | POA: Diagnosis not present

## 2021-08-14 DIAGNOSIS — Z5111 Encounter for antineoplastic chemotherapy: Secondary | ICD-10-CM | POA: Diagnosis not present

## 2021-08-14 DIAGNOSIS — C20 Malignant neoplasm of rectum: Secondary | ICD-10-CM | POA: Diagnosis not present

## 2021-08-14 DIAGNOSIS — C7802 Secondary malignant neoplasm of left lung: Secondary | ICD-10-CM | POA: Diagnosis not present

## 2021-08-14 DIAGNOSIS — G620 Drug-induced polyneuropathy: Secondary | ICD-10-CM | POA: Diagnosis not present

## 2021-08-14 DIAGNOSIS — Z6820 Body mass index (BMI) 20.0-20.9, adult: Secondary | ICD-10-CM | POA: Diagnosis not present

## 2021-09-06 DIAGNOSIS — J189 Pneumonia, unspecified organism: Secondary | ICD-10-CM | POA: Diagnosis not present

## 2021-09-06 DIAGNOSIS — Z20828 Contact with and (suspected) exposure to other viral communicable diseases: Secondary | ICD-10-CM | POA: Diagnosis not present

## 2021-09-06 DIAGNOSIS — Z681 Body mass index (BMI) 19 or less, adult: Secondary | ICD-10-CM | POA: Diagnosis not present

## 2021-11-04 DIAGNOSIS — R300 Dysuria: Secondary | ICD-10-CM | POA: Diagnosis not present

## 2021-11-04 DIAGNOSIS — J181 Lobar pneumonia, unspecified organism: Secondary | ICD-10-CM | POA: Diagnosis not present

## 2021-11-04 DIAGNOSIS — Z6820 Body mass index (BMI) 20.0-20.9, adult: Secondary | ICD-10-CM | POA: Diagnosis not present

## 2021-11-04 DIAGNOSIS — R509 Fever, unspecified: Secondary | ICD-10-CM | POA: Diagnosis not present

## 2021-11-04 DIAGNOSIS — R059 Cough, unspecified: Secondary | ICD-10-CM | POA: Diagnosis not present

## 2021-11-04 DIAGNOSIS — Z20828 Contact with and (suspected) exposure to other viral communicable diseases: Secondary | ICD-10-CM | POA: Diagnosis not present

## 2021-11-25 DIAGNOSIS — R42 Dizziness and giddiness: Secondary | ICD-10-CM | POA: Diagnosis not present

## 2021-11-25 DIAGNOSIS — I10 Essential (primary) hypertension: Secondary | ICD-10-CM | POA: Diagnosis not present

## 2021-11-25 DIAGNOSIS — R531 Weakness: Secondary | ICD-10-CM | POA: Diagnosis not present

## 2021-11-25 DIAGNOSIS — R197 Diarrhea, unspecified: Secondary | ICD-10-CM | POA: Diagnosis not present

## 2021-11-25 DIAGNOSIS — R918 Other nonspecific abnormal finding of lung field: Secondary | ICD-10-CM | POA: Diagnosis not present

## 2021-11-25 DIAGNOSIS — Z87891 Personal history of nicotine dependence: Secondary | ICD-10-CM | POA: Diagnosis not present

## 2021-11-25 DIAGNOSIS — I959 Hypotension, unspecified: Secondary | ICD-10-CM | POA: Diagnosis not present

## 2021-11-25 DIAGNOSIS — Z8701 Personal history of pneumonia (recurrent): Secondary | ICD-10-CM | POA: Diagnosis not present

## 2021-11-25 DIAGNOSIS — M419 Scoliosis, unspecified: Secondary | ICD-10-CM | POA: Diagnosis not present

## 2021-11-25 DIAGNOSIS — Z85048 Personal history of other malignant neoplasm of rectum, rectosigmoid junction, and anus: Secondary | ICD-10-CM | POA: Diagnosis not present

## 2021-11-25 DIAGNOSIS — E039 Hypothyroidism, unspecified: Secondary | ICD-10-CM | POA: Diagnosis not present

## 2021-12-12 DIAGNOSIS — Z5111 Encounter for antineoplastic chemotherapy: Secondary | ICD-10-CM | POA: Diagnosis not present

## 2021-12-12 DIAGNOSIS — R531 Weakness: Secondary | ICD-10-CM | POA: Diagnosis not present

## 2021-12-12 DIAGNOSIS — C20 Malignant neoplasm of rectum: Secondary | ICD-10-CM | POA: Diagnosis not present

## 2021-12-12 DIAGNOSIS — C7802 Secondary malignant neoplasm of left lung: Secondary | ICD-10-CM | POA: Diagnosis not present

## 2021-12-12 DIAGNOSIS — R58 Hemorrhage, not elsewhere classified: Secondary | ICD-10-CM | POA: Diagnosis not present

## 2021-12-12 DIAGNOSIS — T451X5A Adverse effect of antineoplastic and immunosuppressive drugs, initial encounter: Secondary | ICD-10-CM | POA: Diagnosis not present

## 2021-12-12 DIAGNOSIS — R197 Diarrhea, unspecified: Secondary | ICD-10-CM | POA: Diagnosis not present

## 2021-12-12 DIAGNOSIS — R110 Nausea: Secondary | ICD-10-CM | POA: Diagnosis not present

## 2021-12-12 DIAGNOSIS — E7800 Pure hypercholesterolemia, unspecified: Secondary | ICD-10-CM | POA: Diagnosis not present

## 2021-12-12 DIAGNOSIS — R5383 Other fatigue: Secondary | ICD-10-CM | POA: Diagnosis not present

## 2021-12-12 DIAGNOSIS — D508 Other iron deficiency anemias: Secondary | ICD-10-CM | POA: Diagnosis not present

## 2021-12-12 DIAGNOSIS — C7801 Secondary malignant neoplasm of right lung: Secondary | ICD-10-CM | POA: Diagnosis not present

## 2021-12-12 DIAGNOSIS — G620 Drug-induced polyneuropathy: Secondary | ICD-10-CM | POA: Diagnosis not present

## 2021-12-12 DIAGNOSIS — R64 Cachexia: Secondary | ICD-10-CM | POA: Diagnosis not present

## 2021-12-12 DIAGNOSIS — R52 Pain, unspecified: Secondary | ICD-10-CM | POA: Diagnosis not present

## 2021-12-18 DIAGNOSIS — I700 Atherosclerosis of aorta: Secondary | ICD-10-CM | POA: Diagnosis not present

## 2021-12-18 DIAGNOSIS — C20 Malignant neoplasm of rectum: Secondary | ICD-10-CM | POA: Diagnosis not present

## 2021-12-18 DIAGNOSIS — Z681 Body mass index (BMI) 19 or less, adult: Secondary | ICD-10-CM | POA: Diagnosis not present

## 2021-12-18 DIAGNOSIS — E7849 Other hyperlipidemia: Secondary | ICD-10-CM | POA: Diagnosis not present

## 2021-12-18 DIAGNOSIS — C7800 Secondary malignant neoplasm of unspecified lung: Secondary | ICD-10-CM | POA: Diagnosis not present

## 2021-12-18 DIAGNOSIS — R4582 Worries: Secondary | ICD-10-CM | POA: Diagnosis not present

## 2021-12-18 DIAGNOSIS — I10 Essential (primary) hypertension: Secondary | ICD-10-CM | POA: Diagnosis not present

## 2021-12-18 DIAGNOSIS — E440 Moderate protein-calorie malnutrition: Secondary | ICD-10-CM | POA: Diagnosis not present

## 2021-12-18 DIAGNOSIS — E039 Hypothyroidism, unspecified: Secondary | ICD-10-CM | POA: Diagnosis not present

## 2021-12-18 DIAGNOSIS — M1A00X0 Idiopathic chronic gout, unspecified site, without tophus (tophi): Secondary | ICD-10-CM | POA: Diagnosis not present

## 2021-12-18 DIAGNOSIS — Z0001 Encounter for general adult medical examination with abnormal findings: Secondary | ICD-10-CM | POA: Diagnosis not present

## 2022-01-02 DIAGNOSIS — Z681 Body mass index (BMI) 19 or less, adult: Secondary | ICD-10-CM | POA: Diagnosis not present

## 2022-01-02 DIAGNOSIS — C20 Malignant neoplasm of rectum: Secondary | ICD-10-CM | POA: Diagnosis not present

## 2022-01-02 DIAGNOSIS — M1A00X0 Idiopathic chronic gout, unspecified site, without tophus (tophi): Secondary | ICD-10-CM | POA: Diagnosis not present

## 2022-01-02 DIAGNOSIS — I700 Atherosclerosis of aorta: Secondary | ICD-10-CM | POA: Diagnosis not present

## 2022-01-02 DIAGNOSIS — E039 Hypothyroidism, unspecified: Secondary | ICD-10-CM | POA: Diagnosis not present

## 2022-01-02 DIAGNOSIS — C7800 Secondary malignant neoplasm of unspecified lung: Secondary | ICD-10-CM | POA: Diagnosis not present

## 2022-01-02 DIAGNOSIS — E440 Moderate protein-calorie malnutrition: Secondary | ICD-10-CM | POA: Diagnosis not present

## 2022-01-02 DIAGNOSIS — I10 Essential (primary) hypertension: Secondary | ICD-10-CM | POA: Diagnosis not present

## 2022-01-02 DIAGNOSIS — E7849 Other hyperlipidemia: Secondary | ICD-10-CM | POA: Diagnosis not present

## 2022-01-07 DIAGNOSIS — G620 Drug-induced polyneuropathy: Secondary | ICD-10-CM | POA: Diagnosis present

## 2022-01-07 DIAGNOSIS — E7800 Pure hypercholesterolemia, unspecified: Secondary | ICD-10-CM | POA: Diagnosis present

## 2022-01-07 DIAGNOSIS — C7802 Secondary malignant neoplasm of left lung: Secondary | ICD-10-CM | POA: Diagnosis present

## 2022-01-07 DIAGNOSIS — Z801 Family history of malignant neoplasm of trachea, bronchus and lung: Secondary | ICD-10-CM | POA: Diagnosis not present

## 2022-01-07 DIAGNOSIS — Z66 Do not resuscitate: Secondary | ICD-10-CM | POA: Diagnosis present

## 2022-01-07 DIAGNOSIS — Z9221 Personal history of antineoplastic chemotherapy: Secondary | ICD-10-CM | POA: Diagnosis not present

## 2022-01-07 DIAGNOSIS — E039 Hypothyroidism, unspecified: Secondary | ICD-10-CM | POA: Diagnosis present

## 2022-01-07 DIAGNOSIS — I4891 Unspecified atrial fibrillation: Secondary | ICD-10-CM | POA: Diagnosis present

## 2022-01-07 DIAGNOSIS — Z681 Body mass index (BMI) 19 or less, adult: Secondary | ICD-10-CM | POA: Diagnosis not present

## 2022-01-07 DIAGNOSIS — Z825 Family history of asthma and other chronic lower respiratory diseases: Secondary | ICD-10-CM | POA: Diagnosis not present

## 2022-01-07 DIAGNOSIS — C20 Malignant neoplasm of rectum: Secondary | ICD-10-CM | POA: Diagnosis present

## 2022-01-07 DIAGNOSIS — R531 Weakness: Secondary | ICD-10-CM | POA: Diagnosis not present

## 2022-01-07 DIAGNOSIS — Z7401 Bed confinement status: Secondary | ICD-10-CM | POA: Diagnosis not present

## 2022-01-07 DIAGNOSIS — Z87891 Personal history of nicotine dependence: Secondary | ICD-10-CM | POA: Diagnosis not present

## 2022-01-07 DIAGNOSIS — J90 Pleural effusion, not elsewhere classified: Secondary | ICD-10-CM | POA: Diagnosis not present

## 2022-01-07 DIAGNOSIS — I10 Essential (primary) hypertension: Secondary | ICD-10-CM | POA: Diagnosis not present

## 2022-01-07 DIAGNOSIS — C7801 Secondary malignant neoplasm of right lung: Secondary | ICD-10-CM | POA: Diagnosis present

## 2022-01-07 DIAGNOSIS — D500 Iron deficiency anemia secondary to blood loss (chronic): Secondary | ICD-10-CM | POA: Diagnosis present

## 2022-01-07 DIAGNOSIS — R0902 Hypoxemia: Secondary | ICD-10-CM | POA: Diagnosis not present

## 2022-01-07 DIAGNOSIS — E440 Moderate protein-calorie malnutrition: Secondary | ICD-10-CM | POA: Diagnosis present

## 2022-01-07 DIAGNOSIS — R296 Repeated falls: Secondary | ICD-10-CM | POA: Diagnosis present

## 2022-01-07 DIAGNOSIS — Z20822 Contact with and (suspected) exposure to covid-19: Secondary | ICD-10-CM | POA: Diagnosis present

## 2022-01-07 DIAGNOSIS — R911 Solitary pulmonary nodule: Secondary | ICD-10-CM | POA: Diagnosis not present

## 2022-01-07 DIAGNOSIS — R279 Unspecified lack of coordination: Secondary | ICD-10-CM | POA: Diagnosis not present

## 2022-01-07 DIAGNOSIS — C189 Malignant neoplasm of colon, unspecified: Secondary | ICD-10-CM | POA: Diagnosis not present

## 2022-01-07 DIAGNOSIS — J9601 Acute respiratory failure with hypoxia: Secondary | ICD-10-CM | POA: Diagnosis present

## 2022-01-07 DIAGNOSIS — E871 Hypo-osmolality and hyponatremia: Secondary | ICD-10-CM | POA: Diagnosis present

## 2022-01-07 DIAGNOSIS — R197 Diarrhea, unspecified: Secondary | ICD-10-CM | POA: Diagnosis present

## 2022-01-07 DIAGNOSIS — R918 Other nonspecific abnormal finding of lung field: Secondary | ICD-10-CM | POA: Diagnosis not present

## 2022-01-07 DIAGNOSIS — R0602 Shortness of breath: Secondary | ICD-10-CM | POA: Diagnosis not present

## 2022-01-07 DIAGNOSIS — R54 Age-related physical debility: Secondary | ICD-10-CM | POA: Diagnosis present

## 2022-01-07 DIAGNOSIS — C787 Secondary malignant neoplasm of liver and intrahepatic bile duct: Secondary | ICD-10-CM | POA: Diagnosis present

## 2022-01-07 DIAGNOSIS — M1990 Unspecified osteoarthritis, unspecified site: Secondary | ICD-10-CM | POA: Diagnosis present

## 2022-01-07 DIAGNOSIS — R29898 Other symptoms and signs involving the musculoskeletal system: Secondary | ICD-10-CM | POA: Diagnosis not present

## 2022-01-08 DIAGNOSIS — C7801 Secondary malignant neoplasm of right lung: Secondary | ICD-10-CM | POA: Diagnosis present

## 2022-01-08 DIAGNOSIS — Z66 Do not resuscitate: Secondary | ICD-10-CM | POA: Diagnosis present

## 2022-01-08 DIAGNOSIS — I4891 Unspecified atrial fibrillation: Secondary | ICD-10-CM | POA: Diagnosis present

## 2022-01-08 DIAGNOSIS — C189 Malignant neoplasm of colon, unspecified: Secondary | ICD-10-CM | POA: Diagnosis not present

## 2022-01-08 DIAGNOSIS — E039 Hypothyroidism, unspecified: Secondary | ICD-10-CM | POA: Diagnosis present

## 2022-01-08 DIAGNOSIS — R279 Unspecified lack of coordination: Secondary | ICD-10-CM | POA: Diagnosis not present

## 2022-01-08 DIAGNOSIS — J9601 Acute respiratory failure with hypoxia: Secondary | ICD-10-CM | POA: Diagnosis present

## 2022-01-08 DIAGNOSIS — R54 Age-related physical debility: Secondary | ICD-10-CM | POA: Diagnosis present

## 2022-01-08 DIAGNOSIS — Z801 Family history of malignant neoplasm of trachea, bronchus and lung: Secondary | ICD-10-CM | POA: Diagnosis not present

## 2022-01-08 DIAGNOSIS — Z681 Body mass index (BMI) 19 or less, adult: Secondary | ICD-10-CM | POA: Diagnosis not present

## 2022-01-08 DIAGNOSIS — R197 Diarrhea, unspecified: Secondary | ICD-10-CM | POA: Diagnosis present

## 2022-01-08 DIAGNOSIS — M1990 Unspecified osteoarthritis, unspecified site: Secondary | ICD-10-CM | POA: Diagnosis present

## 2022-01-08 DIAGNOSIS — C7802 Secondary malignant neoplasm of left lung: Secondary | ICD-10-CM | POA: Diagnosis present

## 2022-01-08 DIAGNOSIS — Z20822 Contact with and (suspected) exposure to covid-19: Secondary | ICD-10-CM | POA: Diagnosis present

## 2022-01-08 DIAGNOSIS — Z825 Family history of asthma and other chronic lower respiratory diseases: Secondary | ICD-10-CM | POA: Diagnosis not present

## 2022-01-08 DIAGNOSIS — I10 Essential (primary) hypertension: Secondary | ICD-10-CM | POA: Diagnosis present

## 2022-01-08 DIAGNOSIS — D500 Iron deficiency anemia secondary to blood loss (chronic): Secondary | ICD-10-CM | POA: Diagnosis present

## 2022-01-08 DIAGNOSIS — C787 Secondary malignant neoplasm of liver and intrahepatic bile duct: Secondary | ICD-10-CM | POA: Diagnosis present

## 2022-01-08 DIAGNOSIS — R296 Repeated falls: Secondary | ICD-10-CM | POA: Diagnosis present

## 2022-01-08 DIAGNOSIS — G620 Drug-induced polyneuropathy: Secondary | ICD-10-CM | POA: Diagnosis present

## 2022-01-08 DIAGNOSIS — R29898 Other symptoms and signs involving the musculoskeletal system: Secondary | ICD-10-CM | POA: Diagnosis not present

## 2022-01-08 DIAGNOSIS — E440 Moderate protein-calorie malnutrition: Secondary | ICD-10-CM | POA: Diagnosis present

## 2022-01-08 DIAGNOSIS — Z9221 Personal history of antineoplastic chemotherapy: Secondary | ICD-10-CM | POA: Diagnosis not present

## 2022-01-08 DIAGNOSIS — Z7401 Bed confinement status: Secondary | ICD-10-CM | POA: Diagnosis not present

## 2022-01-08 DIAGNOSIS — E871 Hypo-osmolality and hyponatremia: Secondary | ICD-10-CM | POA: Diagnosis present

## 2022-01-08 DIAGNOSIS — E7800 Pure hypercholesterolemia, unspecified: Secondary | ICD-10-CM | POA: Diagnosis present

## 2022-01-08 DIAGNOSIS — C20 Malignant neoplasm of rectum: Secondary | ICD-10-CM | POA: Diagnosis present

## 2022-01-08 DIAGNOSIS — Z87891 Personal history of nicotine dependence: Secondary | ICD-10-CM | POA: Diagnosis not present

## 2022-01-11 DIAGNOSIS — M159 Polyosteoarthritis, unspecified: Secondary | ICD-10-CM | POA: Diagnosis not present

## 2022-01-11 DIAGNOSIS — R0600 Dyspnea, unspecified: Secondary | ICD-10-CM | POA: Diagnosis not present

## 2022-01-11 DIAGNOSIS — E440 Moderate protein-calorie malnutrition: Secondary | ICD-10-CM | POA: Diagnosis not present

## 2022-01-11 DIAGNOSIS — R531 Weakness: Secondary | ICD-10-CM | POA: Diagnosis not present

## 2022-01-11 DIAGNOSIS — I10 Essential (primary) hypertension: Secondary | ICD-10-CM | POA: Diagnosis not present

## 2022-01-11 DIAGNOSIS — R52 Pain, unspecified: Secondary | ICD-10-CM | POA: Diagnosis not present

## 2022-01-11 DIAGNOSIS — R197 Diarrhea, unspecified: Secondary | ICD-10-CM | POA: Diagnosis not present

## 2022-01-11 DIAGNOSIS — R296 Repeated falls: Secondary | ICD-10-CM | POA: Diagnosis not present

## 2022-01-11 DIAGNOSIS — C7800 Secondary malignant neoplasm of unspecified lung: Secondary | ICD-10-CM | POA: Diagnosis not present

## 2022-01-11 DIAGNOSIS — G620 Drug-induced polyneuropathy: Secondary | ICD-10-CM | POA: Diagnosis not present

## 2022-01-11 DIAGNOSIS — C189 Malignant neoplasm of colon, unspecified: Secondary | ICD-10-CM | POA: Diagnosis not present

## 2022-01-13 DIAGNOSIS — R0600 Dyspnea, unspecified: Secondary | ICD-10-CM | POA: Diagnosis not present

## 2022-01-13 DIAGNOSIS — R296 Repeated falls: Secondary | ICD-10-CM | POA: Diagnosis not present

## 2022-01-13 DIAGNOSIS — E440 Moderate protein-calorie malnutrition: Secondary | ICD-10-CM | POA: Diagnosis not present

## 2022-01-13 DIAGNOSIS — R52 Pain, unspecified: Secondary | ICD-10-CM | POA: Diagnosis not present

## 2022-01-13 DIAGNOSIS — R531 Weakness: Secondary | ICD-10-CM | POA: Diagnosis not present

## 2022-01-13 DIAGNOSIS — C189 Malignant neoplasm of colon, unspecified: Secondary | ICD-10-CM | POA: Diagnosis not present

## 2022-01-14 DIAGNOSIS — R52 Pain, unspecified: Secondary | ICD-10-CM | POA: Diagnosis not present

## 2022-01-14 DIAGNOSIS — C189 Malignant neoplasm of colon, unspecified: Secondary | ICD-10-CM | POA: Diagnosis not present

## 2022-01-14 DIAGNOSIS — R531 Weakness: Secondary | ICD-10-CM | POA: Diagnosis not present

## 2022-01-14 DIAGNOSIS — R296 Repeated falls: Secondary | ICD-10-CM | POA: Diagnosis not present

## 2022-01-14 DIAGNOSIS — E440 Moderate protein-calorie malnutrition: Secondary | ICD-10-CM | POA: Diagnosis not present

## 2022-01-14 DIAGNOSIS — R0600 Dyspnea, unspecified: Secondary | ICD-10-CM | POA: Diagnosis not present

## 2022-01-15 DIAGNOSIS — R531 Weakness: Secondary | ICD-10-CM | POA: Diagnosis not present

## 2022-01-15 DIAGNOSIS — E440 Moderate protein-calorie malnutrition: Secondary | ICD-10-CM | POA: Diagnosis not present

## 2022-01-15 DIAGNOSIS — R52 Pain, unspecified: Secondary | ICD-10-CM | POA: Diagnosis not present

## 2022-01-15 DIAGNOSIS — C189 Malignant neoplasm of colon, unspecified: Secondary | ICD-10-CM | POA: Diagnosis not present

## 2022-01-15 DIAGNOSIS — R296 Repeated falls: Secondary | ICD-10-CM | POA: Diagnosis not present

## 2022-01-15 DIAGNOSIS — R0600 Dyspnea, unspecified: Secondary | ICD-10-CM | POA: Diagnosis not present

## 2022-01-17 DIAGNOSIS — E440 Moderate protein-calorie malnutrition: Secondary | ICD-10-CM | POA: Diagnosis not present

## 2022-01-17 DIAGNOSIS — R197 Diarrhea, unspecified: Secondary | ICD-10-CM | POA: Diagnosis not present

## 2022-01-17 DIAGNOSIS — I10 Essential (primary) hypertension: Secondary | ICD-10-CM | POA: Diagnosis not present

## 2022-01-17 DIAGNOSIS — M159 Polyosteoarthritis, unspecified: Secondary | ICD-10-CM | POA: Diagnosis not present

## 2022-01-17 DIAGNOSIS — C189 Malignant neoplasm of colon, unspecified: Secondary | ICD-10-CM | POA: Diagnosis not present

## 2022-01-17 DIAGNOSIS — C7800 Secondary malignant neoplasm of unspecified lung: Secondary | ICD-10-CM | POA: Diagnosis not present

## 2022-01-17 DIAGNOSIS — R296 Repeated falls: Secondary | ICD-10-CM | POA: Diagnosis not present

## 2022-01-17 DIAGNOSIS — R0600 Dyspnea, unspecified: Secondary | ICD-10-CM | POA: Diagnosis not present

## 2022-01-17 DIAGNOSIS — R531 Weakness: Secondary | ICD-10-CM | POA: Diagnosis not present

## 2022-01-17 DIAGNOSIS — R52 Pain, unspecified: Secondary | ICD-10-CM | POA: Diagnosis not present

## 2022-01-17 DIAGNOSIS — G620 Drug-induced polyneuropathy: Secondary | ICD-10-CM | POA: Diagnosis not present

## 2022-01-19 DIAGNOSIS — C189 Malignant neoplasm of colon, unspecified: Secondary | ICD-10-CM | POA: Diagnosis not present

## 2022-01-19 DIAGNOSIS — R531 Weakness: Secondary | ICD-10-CM | POA: Diagnosis not present

## 2022-01-19 DIAGNOSIS — R0600 Dyspnea, unspecified: Secondary | ICD-10-CM | POA: Diagnosis not present

## 2022-01-19 DIAGNOSIS — R52 Pain, unspecified: Secondary | ICD-10-CM | POA: Diagnosis not present

## 2022-01-19 DIAGNOSIS — R296 Repeated falls: Secondary | ICD-10-CM | POA: Diagnosis not present

## 2022-01-19 DIAGNOSIS — E440 Moderate protein-calorie malnutrition: Secondary | ICD-10-CM | POA: Diagnosis not present

## 2022-01-20 DIAGNOSIS — E440 Moderate protein-calorie malnutrition: Secondary | ICD-10-CM | POA: Diagnosis not present

## 2022-01-20 DIAGNOSIS — R531 Weakness: Secondary | ICD-10-CM | POA: Diagnosis not present

## 2022-01-20 DIAGNOSIS — R296 Repeated falls: Secondary | ICD-10-CM | POA: Diagnosis not present

## 2022-01-20 DIAGNOSIS — R0600 Dyspnea, unspecified: Secondary | ICD-10-CM | POA: Diagnosis not present

## 2022-01-20 DIAGNOSIS — R52 Pain, unspecified: Secondary | ICD-10-CM | POA: Diagnosis not present

## 2022-01-20 DIAGNOSIS — C189 Malignant neoplasm of colon, unspecified: Secondary | ICD-10-CM | POA: Diagnosis not present

## 2022-02-16 DEATH — deceased
